# Patient Record
Sex: Female | Born: 1945 | Race: White | Hispanic: No | Smoking: Never smoker
Health system: Southern US, Community
[De-identification: ages and names within clinical notes are randomized; demographics above are authoritative.]

## PROBLEM LIST (undated history)

## (undated) DIAGNOSIS — R7303 Prediabetes: Secondary | ICD-10-CM

## (undated) DIAGNOSIS — M1990 Unspecified osteoarthritis, unspecified site: Secondary | ICD-10-CM

## (undated) DIAGNOSIS — J329 Chronic sinusitis, unspecified: Secondary | ICD-10-CM

## (undated) DIAGNOSIS — D649 Anemia, unspecified: Secondary | ICD-10-CM

## (undated) DIAGNOSIS — I10 Essential (primary) hypertension: Secondary | ICD-10-CM

## (undated) DIAGNOSIS — R011 Cardiac murmur, unspecified: Secondary | ICD-10-CM

## (undated) DIAGNOSIS — R0600 Dyspnea, unspecified: Secondary | ICD-10-CM

## (undated) DIAGNOSIS — K219 Gastro-esophageal reflux disease without esophagitis: Secondary | ICD-10-CM

## (undated) DIAGNOSIS — C4490 Unspecified malignant neoplasm of skin, unspecified: Secondary | ICD-10-CM

## (undated) DIAGNOSIS — E785 Hyperlipidemia, unspecified: Secondary | ICD-10-CM

## (undated) DIAGNOSIS — N819 Female genital prolapse, unspecified: Secondary | ICD-10-CM

## (undated) DIAGNOSIS — E559 Vitamin D deficiency, unspecified: Secondary | ICD-10-CM

## (undated) DIAGNOSIS — Z87442 Personal history of urinary calculi: Secondary | ICD-10-CM

## (undated) DIAGNOSIS — K08409 Partial loss of teeth, unspecified cause, unspecified class: Secondary | ICD-10-CM

## (undated) HISTORY — PX: HIP ARTHROPLASTY: SHX981

## (undated) HISTORY — PX: CATARACT EXTRACTION W/ INTRAOCULAR LENS IMPLANT: SHX1309

## (undated) HISTORY — PX: COLON SURGERY: SHX602

---

## 2006-10-11 ENCOUNTER — Ambulatory Visit (HOSPITAL_BASED_OUTPATIENT_CLINIC_OR_DEPARTMENT_OTHER): Admission: RE | Admit: 2006-10-11 | Discharge: 2006-10-11 | Payer: Self-pay | Admitting: Plastic Surgery

## 2006-10-11 ENCOUNTER — Encounter (INDEPENDENT_AMBULATORY_CARE_PROVIDER_SITE_OTHER): Payer: Self-pay | Admitting: *Deleted

## 2008-12-27 DIAGNOSIS — G459 Transient cerebral ischemic attack, unspecified: Secondary | ICD-10-CM

## 2008-12-27 HISTORY — DX: Transient cerebral ischemic attack, unspecified: G45.9

## 2010-11-11 ENCOUNTER — Inpatient Hospital Stay (HOSPITAL_COMMUNITY): Admission: RE | Admit: 2010-11-11 | Discharge: 2010-11-15 | Payer: Self-pay | Admitting: Orthopedic Surgery

## 2011-02-26 IMAGING — CR DG HIP COMPLETE 2+V*R*
3 series · 3 of 3 positions shown · non-contrast
Comparison: None.

CLINICAL DATA: Preop for right total hip prosthesis with the
advanced right hip osteoarthritis.

RIGHT HIP - COMPLETE 2+ VIEW

[t pelvis a.p.]
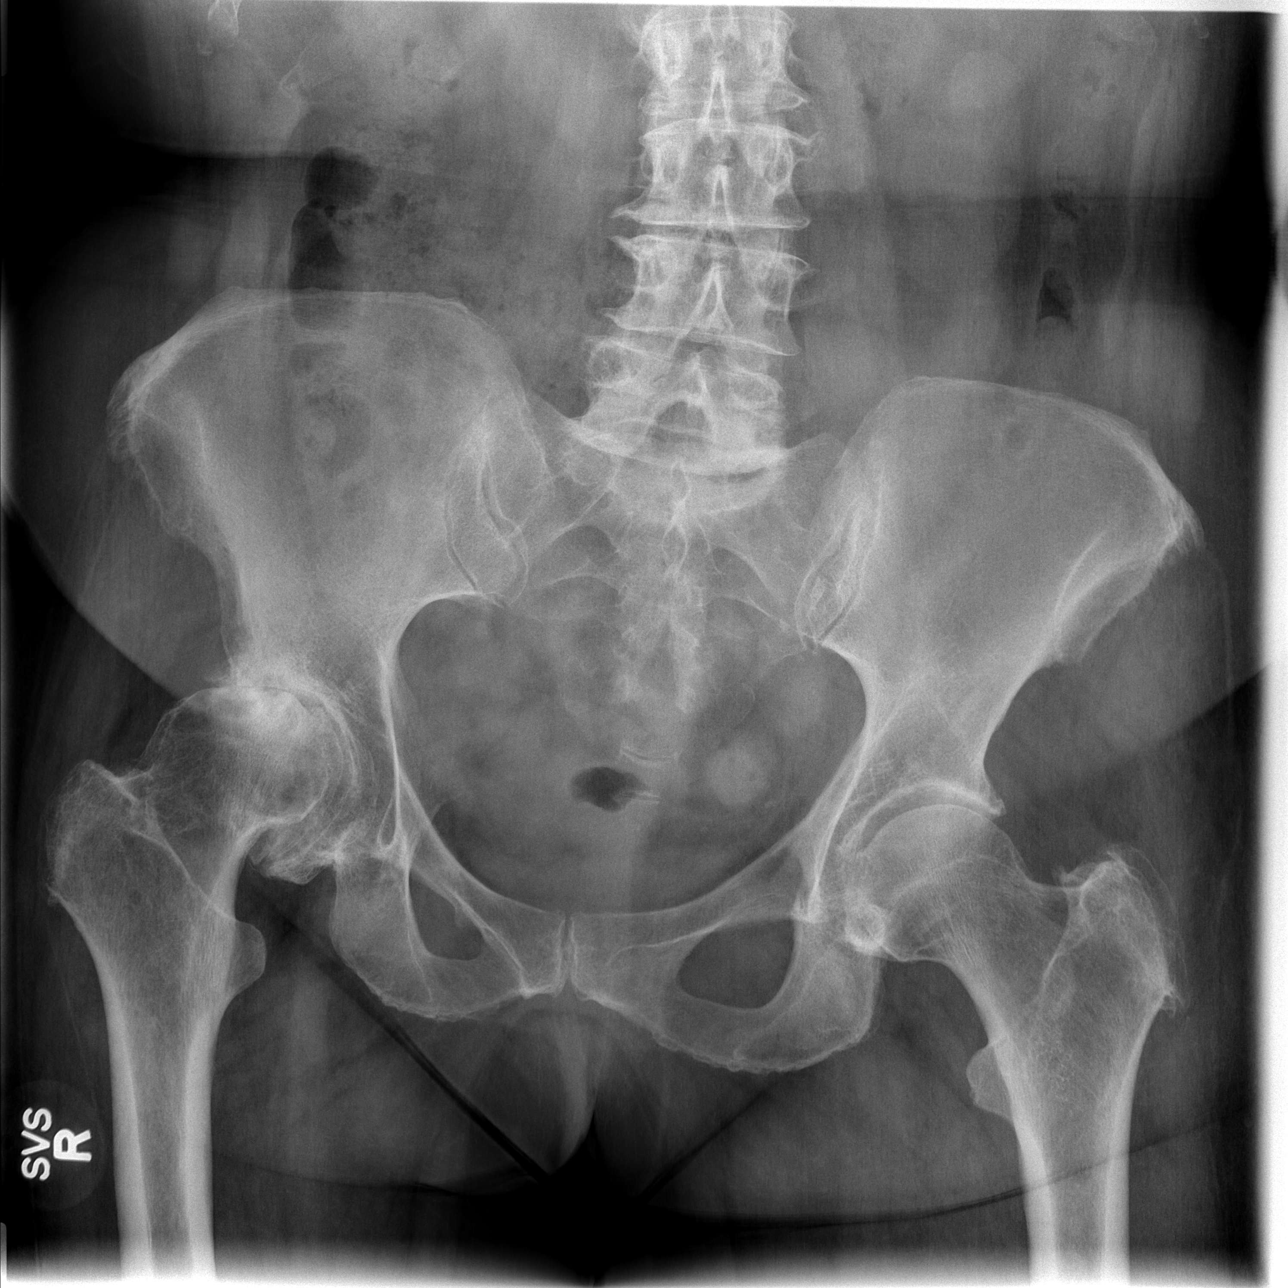

[t hip ap right]
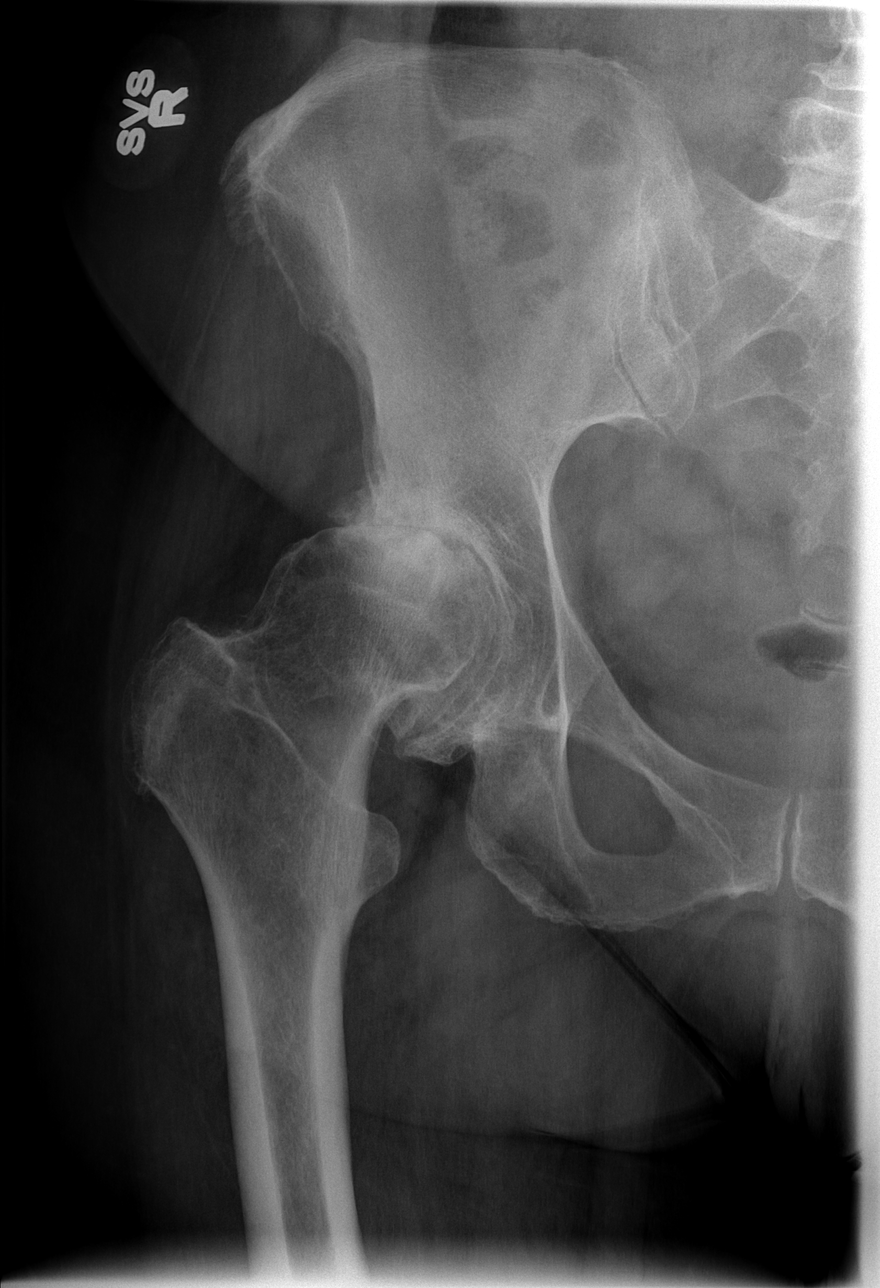

[t hip frog leg right]
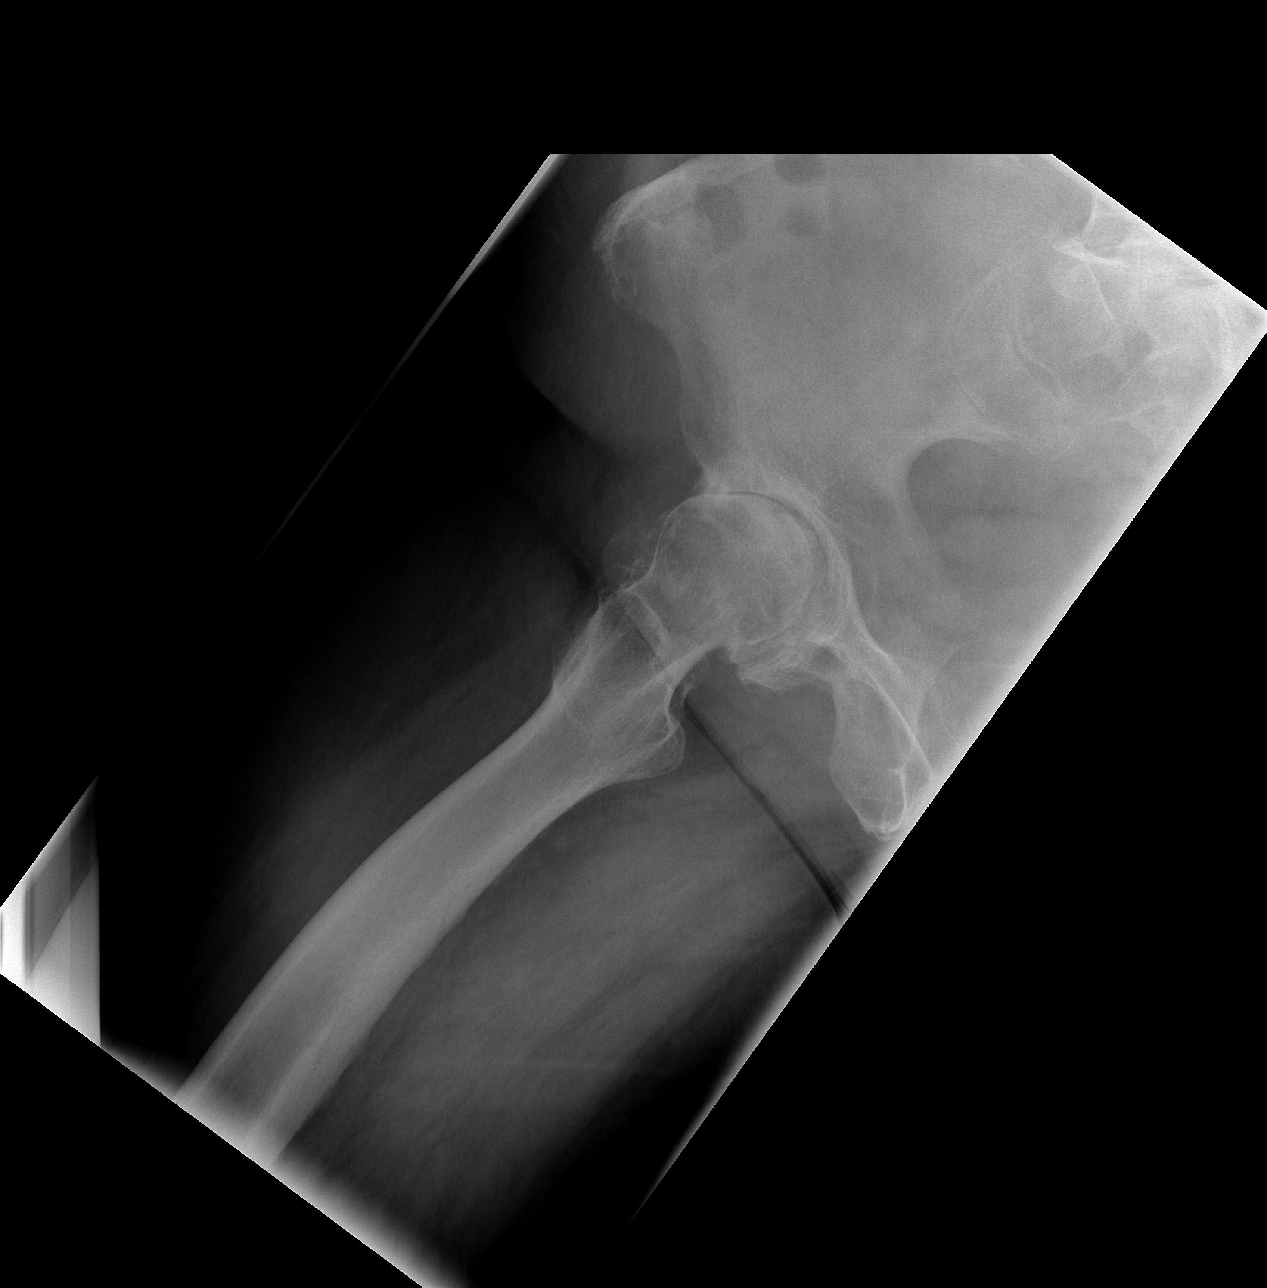

[3 of 3 positions shown; findings below may reference images not displayed]

FINDINGS: Advanced right hip osteoarthritic change superolaterally
seen (with chronic deformity and subchondral cysts supralateral
right humeral head).  Slight levoscoliosis with multilevel
degenerative disc and vertebral changes inferior lumbar spine is
seen.  Slight supralateral left hip osteoarthritic change noted.
No acute findings.
IMPRESSION: 1.  Advanced right hip osteoarthritis with slight supralateral left
hip osteoarthritis.
2.  Multilevel degenerative disc vertebral changes inferior lumbar
spine.
3.  No acute findings.

## 2011-03-03 IMAGING — CR DG PORTABLE PELVIS
1 series · 1 of 1 positions shown · non-contrast
Comparison: 11/06/2010

CLINICAL DATA: Post right hip arthroplasty

PORTABLE PELVIS

[series [date]]
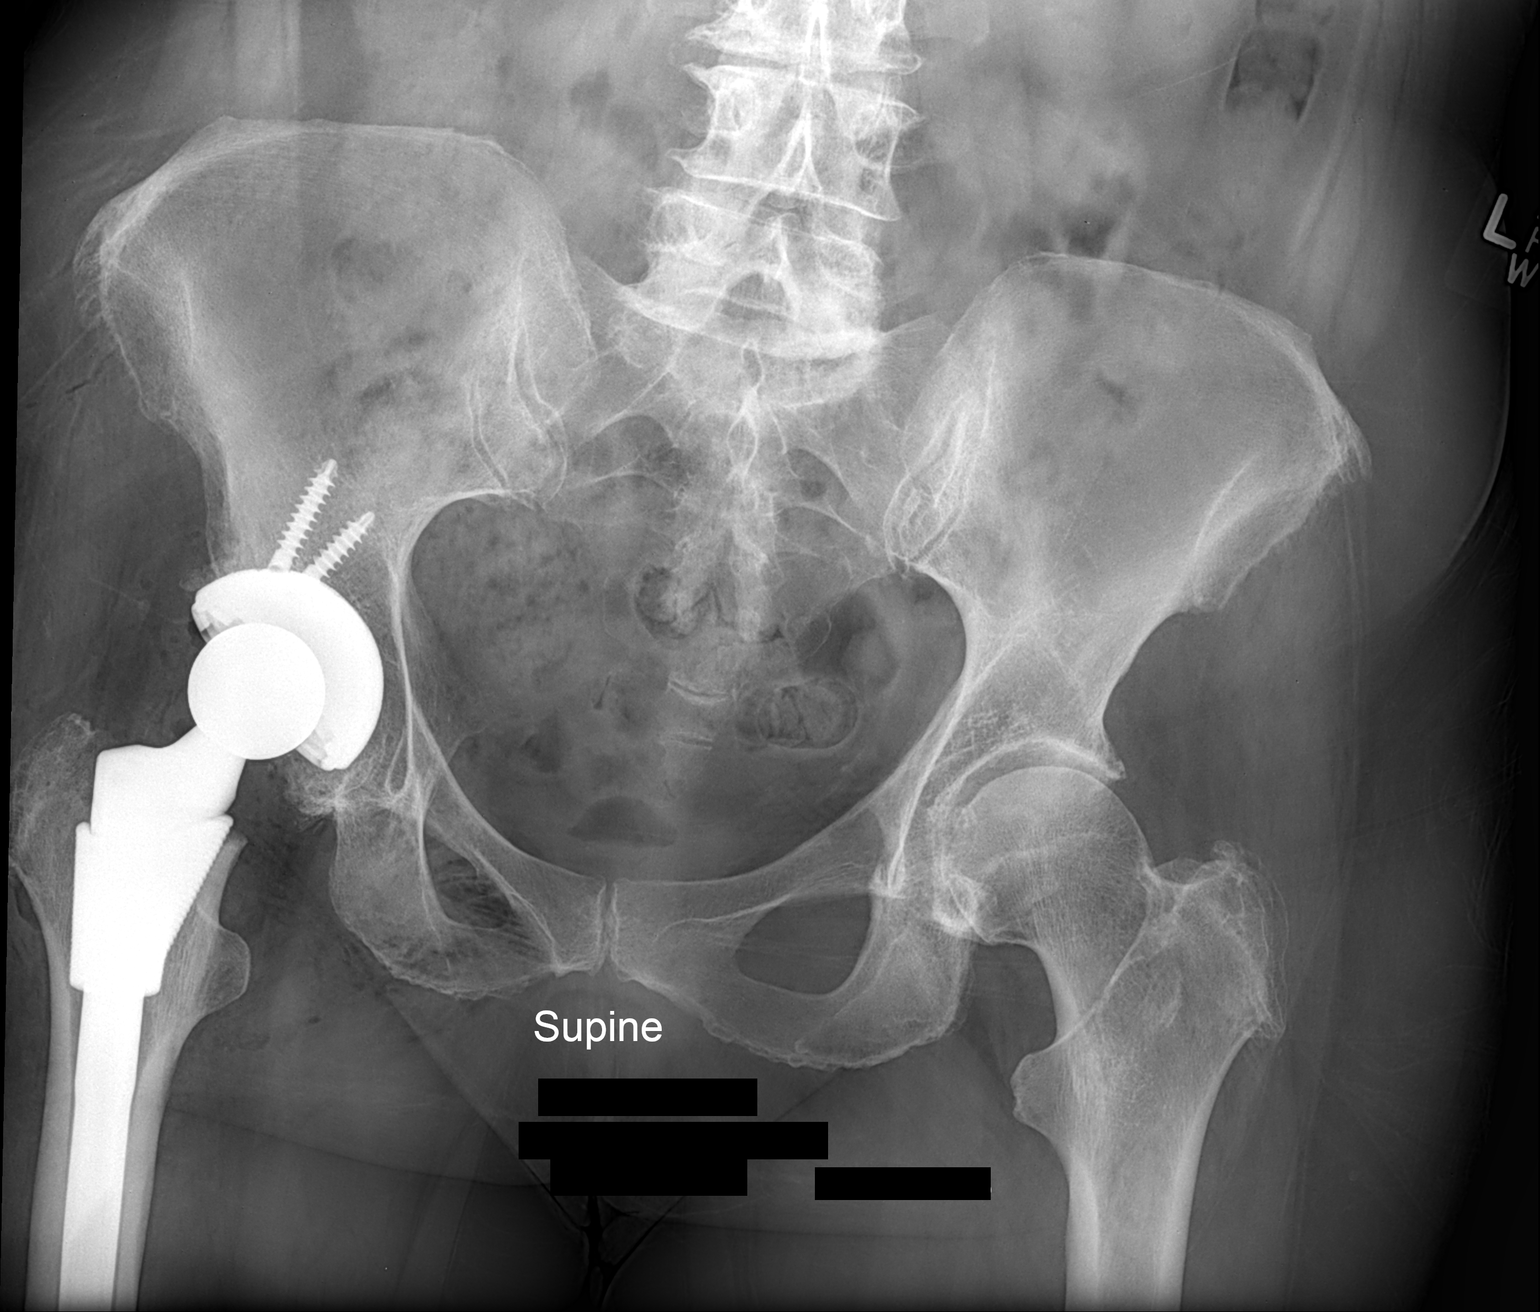

[1 of 1 positions shown; findings below may reference images not displayed]

FINDINGS: Post right hip arthroplasty.  Good position and alignment
without obvious complications, in one-view.
IMPRESSION: Good position and alignment in the AP projection, following right
hip arthroplasty.

## 2011-03-09 LAB — URINALYSIS, ROUTINE W REFLEX MICROSCOPIC
Bilirubin Urine: NEGATIVE
Hgb urine dipstick: NEGATIVE
Specific Gravity, Urine: 1.015 (ref 1.005–1.030)
pH: 7 (ref 5.0–8.0)

## 2011-03-09 LAB — CBC
HCT: 30.9 % — ABNORMAL LOW (ref 36.0–46.0)
HCT: 32.1 % — ABNORMAL LOW (ref 36.0–46.0)
HCT: 32.2 % — ABNORMAL LOW (ref 36.0–46.0)
Hemoglobin: 11.3 g/dL — ABNORMAL LOW (ref 12.0–15.0)
Hemoglobin: 14.5 g/dL (ref 12.0–15.0)
MCH: 33.8 pg (ref 26.0–34.0)
MCH: 34.2 pg — ABNORMAL HIGH (ref 26.0–34.0)
MCH: 34.4 pg — ABNORMAL HIGH (ref 26.0–34.0)
MCHC: 35.2 g/dL (ref 30.0–36.0)
MCV: 96.6 fL (ref 78.0–100.0)
MCV: 97.1 fL (ref 78.0–100.0)
MCV: 98.5 fL (ref 78.0–100.0)
MCV: 98.6 fL (ref 78.0–100.0)
RBC: 3.14 MIL/uL — ABNORMAL LOW (ref 3.87–5.11)
RBC: 3.27 MIL/uL — ABNORMAL LOW (ref 3.87–5.11)
RBC: 4.3 MIL/uL (ref 3.87–5.11)
RDW: 11.5 % (ref 11.5–15.5)
RDW: 11.9 % (ref 11.5–15.5)
WBC: 11.9 10*3/uL — ABNORMAL HIGH (ref 4.0–10.5)
WBC: 12 10*3/uL — ABNORMAL HIGH (ref 4.0–10.5)
WBC: 8.1 10*3/uL (ref 4.0–10.5)

## 2011-03-09 LAB — COMPREHENSIVE METABOLIC PANEL
AST: 25 U/L (ref 0–37)
Alkaline Phosphatase: 63 U/L (ref 39–117)
BUN: 11 mg/dL (ref 6–23)
CO2: 30 mEq/L (ref 19–32)
Chloride: 101 mEq/L (ref 96–112)
Creatinine, Ser: 0.71 mg/dL (ref 0.4–1.2)
GFR calc Af Amer: >60 mL/min (ref >60)
GFR calc non Af Amer: >60 mL/min (ref >60)
Potassium: 4.1 mEq/L (ref 3.5–5.1)
Total Bilirubin: 0.5 mg/dL (ref 0.3–1.2)

## 2011-03-09 LAB — TYPE AND SCREEN
ABO/RH(D): A POS
Antibody Screen: POSITIVE
DAT, IgG: NEGATIVE
PT AG Type: NEGATIVE
Unit division: 0
Unit division: 0

## 2011-03-09 LAB — BASIC METABOLIC PANEL
BUN: 7 mg/dL (ref 6–23)
BUN: 9 mg/dL (ref 6–23)
CO2: 30 mEq/L (ref 19–32)
CO2: 33 mEq/L — ABNORMAL HIGH (ref 19–32)
Chloride: 103 mEq/L (ref 96–112)
Chloride: 97 mEq/L (ref 96–112)
Creatinine, Ser: 0.77 mg/dL (ref 0.4–1.2)
GFR calc non Af Amer: >60 mL/min (ref >60)
Glucose, Bld: 128 mg/dL — ABNORMAL HIGH (ref 70–99)
Potassium: 3 mEq/L — ABNORMAL LOW (ref 3.5–5.1)
Potassium: 4.8 mEq/L (ref 3.5–5.1)
Sodium: 137 mEq/L (ref 135–145)

## 2011-03-09 LAB — PROTIME-INR
INR: 1.17 (ref 0.00–1.49)
INR: 2.05 — ABNORMAL HIGH (ref 0.00–1.49)
Prothrombin Time: 23.3 seconds — ABNORMAL HIGH (ref 11.6–15.2)

## 2011-03-09 LAB — APTT: aPTT: 32 seconds (ref 24–37)

## 2023-04-05 NOTE — H&P (Signed)
TOTAL HIP ADMISSION H&P  Patient is admitted for left total hip arthroplasty.  Subjective:  Chief Complaint: Left hip pain  HPI: Kaitlyn Gallegos, 77 y.o. female, has a history of pain and functional disability in the left hip due to arthritis and patient has failed non-surgical conservative treatments for greater than 12 weeks to include NSAID's and/or analgesics and activity modification. Onset of symptoms was gradual, starting  several  years ago with gradually worsening course since that time. The patient noted no past surgery on the left hip. Patient currently rates pain in the left hip at 8 out of 10 with activity. Patient has night pain, worsening of pain with activity and weight bearing, and trendelenberg gait. Patient has evidence of  severe bone-on-bone arthritis with massive marginal osteophytes and some slight erosion of the femoral head  by imaging studies. This condition presents safety issues increasing the risk of falls. There is no current active infection.  There are no problems to display for this patient.   No past medical history on file.  Prior to Admission medications   Not on File    Not on File  Social History   Socioeconomic History   Marital status: Married    Spouse name: Not on file   Number of children: Not on file   Years of education: Not on file   Highest education level: Not on file  Occupational History   Not on file  Tobacco Use   Smoking status: Not on file   Smokeless tobacco: Not on file  Substance and Sexual Activity   Alcohol use: Not on file   Drug use: Not on file   Sexual activity: Not on file  Other Topics Concern   Not on file  Social History Narrative   Not on file   Social Determinants of Health   Financial Resource Strain: Not on file  Food Insecurity: Not on file  Transportation Needs: Not on file  Physical Activity: Not on file  Stress: Not on file  Social Connections: Not on file  Intimate Partner Violence: Not on file     Tobacco Use: Not on file   Social History   Substance and Sexual Activity  Alcohol Use Not on file    No family history on file.  Review of Systems  Constitutional:  Negative for chills and fever.  HENT: Negative.    Eyes: Negative.   Respiratory:  Negative for cough and shortness of breath.   Cardiovascular:  Negative for chest pain and palpitations.  Gastrointestinal:  Negative for abdominal pain, constipation, diarrhea, nausea and vomiting.  Genitourinary:  Negative for dysuria, frequency and urgency.  Musculoskeletal:  Positive for joint pain.  Skin:  Negative for rash.   Objective:  Physical Exam: Well nourished and well developed.  General: Alert and oriented x3, cooperative and pleasant, no acute distress.  Head: normocephalic, atraumatic, neck supple.  Eyes: EOMI. Abdomen: non-tender to palpation and soft, normoactive bowel sounds. Musculoskeletal: The patient has a significantly antalgic gait pattern favoring the left side.  Right Hip Exam: The range of motion: Flexion to 130 degrees, Internal Rotation to 30 degrees, External Rotation to 40 degrees, and abduction to 40 degrees without discomfort. There is no tenderness over the greater trochanteric bursa.  Left Hip Exam: The range of motion: Flexion to 100 degrees, Internal Rotation to 0 degrees, External Rotation to 0 degrees, and abduction to 10 degrees without discomfort. There is no tenderness over the greater trochanteric bursa.  Calves soft  and nontender. Motor function intact in LE. Strength 5/5 LE bilaterally. Neuro: Distal pulses 2+. Sensation to light touch intact in LE.  Vital signs in last 24 hours: BP: ()/()  Arterial Line BP: ()/()   Imaging Review Plain radiographs demonstrate moderate degenerative joint disease of the left hip. The bone quality appears to be adequate for age and reported activity level.  Assessment/Plan:  End stage arthritis, left hip  The patient history, physical  examination, clinical judgement of the provider and imaging studies are consistent with end stage degenerative joint disease of the left hip and total hip arthroplasty is deemed medically necessary. The treatment options including medical management, injection therapy, arthroscopy and arthroplasty were discussed at length. The risks and benefits of total hip arthroplasty were presented and reviewed. The risks due to aseptic loosening, infection, stiffness, dislocation/subluxation, thromboembolic complications and other imponderables were discussed. The patient acknowledged the explanation, agreed to proceed with the plan and consent was signed. Patient is being admitted for inpatient treatment for surgery, pain control, PT, OT, prophylactic antibiotics, VTE prophylaxis, progressive ambulation and ADLs and discharge planning.The patient is planning to be discharged  home .  Therapy Plans: HEP Disposition: Home with Daughter Planned DVT Prophylaxis: Xarelto 10 mg (hx of stroke) DME Needed: RW PCP: Jola Schmidt, MD (clearance received) TXA: IV Allergies: lisinopril (cough), metformin (nausea) Anesthesia Concerns: None BMI: 29.8 Last HgbA1c: not diabetic  [facility]: [facility], [place])  Other: -Recently stopped clopidogrel and switched to one 81 mg aspirin for chronic blood thinner  - Patient was instructed on what medications to stop prior to surgery. - Follow-up visit in 2 weeks with Dr. Lequita Halt - Begin physical therapy following surgery - Pre-operative lab work as pre-surgical testing - Prescriptions will be provided in hospital at time of discharge  R. Arcola Jansky, PA-C Orthopedic Surgery [facility] [place]

## 2023-04-14 ENCOUNTER — Encounter (HOSPITAL_COMMUNITY): Payer: Self-pay

## 2023-04-14 NOTE — Patient Instructions (Addendum)
SURGICAL WAITING ROOM VISITATION  Patients having surgery or a procedure may have no more than 2 support people in the waiting area - these visitors may rotate.    Children under the age of 14 must have an adult with them who is not the patient.  Due to an increase in RSV and influenza rates and associated hospitalizations, children ages 52 and under may not visit patients in [facility] hospitals.  If the patient needs to stay at the hospital during part of their recovery, the visitor guidelines for inpatient rooms apply. Pre-op nurse will coordinate an appropriate time for 1 support person to accompany patient in pre-op.  This support person may not rotate.    Please refer to the [facility] website for the visitor guidelines for Inpatients (after your surgery is over and you are in a regular room).       Your procedure is scheduled on: 04-25-23   Report to [facility] Main Entrance    Report to admitting at 10:10 AM   Call this number if you have problems the morning of surgery [phone]   Do not eat food :After Midnight.   After Midnight you may have the following liquids until 9:40 AM DAY OF SURGERY  Water Non-Citrus Juices (without pulp, NO RED-Apple, White grape, White cranberry) Black Coffee (NO MILK/CREAM OR CREAMERS, sugar ok)  Clear Tea (NO MILK/CREAM OR CREAMERS, sugar ok) regular and decaf                             Plain Jell-O (NO RED)                                           Fruit ices (not with fruit pulp, NO RED)                                     Popsicles (NO RED)                                                               Sports drinks like Gatorade (NO RED)                  The day of surgery:  Drink ONE (1) Pre-Surgery G2 at 9:40 AM the morning of surgery. Drink in one sitting. Do not sip.  This drink was given to you during your hospital  pre-op appointment visit. Nothing else to drink after completing the Pre-Surgery G2.          If  you have questions, please contact your surgeon's office.   FOLLOW  ANY ADDITIONAL PRE OP INSTRUCTIONS YOU RECEIVED FROM YOUR SURGEON'S OFFICE!!!     Oral Hygiene is also important to reduce your risk of infection.                                    Remember - BRUSH YOUR TEETH THE MORNING OF SURGERY WITH YOUR REGULAR TOOTHPASTE  DENTURES WILL BE  REMOVED PRIOR TO SURGERY PLEASE DO NOT APPLY "Poly grip" OR ADHESIVES!!!   Do NOT smoke after Midnight   Take these medicines the morning of surgery with A SIP OF WATER:   Fenofibrate  Allegra   Hydralazine  Trospium  Tylenol or Tramadol if needed                              You may not have any metal on your body including hair pins, jewelry, and body piercing             Do not wear make-up, lotions, powders, perfumes or deodorant  Do not wear nail polish including gel and S&S, artificial/acrylic nails, or any other type of covering on natural nails including finger and toenails. If you have artificial nails, gel coating, etc. that needs to be removed by a nail salon please have this removed prior to surgery or surgery may need to be canceled/ delayed if the surgeon/ anesthesia feels like they are unable to be safely monitored.   Do not shave  48 hours prior to surgery.    Do not bring valuables to the hospital. [facility] IS NOT  RESPONSIBLE   FOR VALUABLES.   Contacts, glasses, dentures or bridgework may not be worn into surgery.   Bring small overnight bag day of surgery.   DO NOT BRING YOUR HOME MEDICATIONS TO THE HOSPITAL. PHARMACY WILL DISPENSE MEDICATIONS LISTED ON YOUR MEDICATION LIST TO YOU DURING YOUR ADMISSION IN THE HOSPITAL!               Please read over the following fact sheets you were given: IF YOU HAVE QUESTIONS ABOUT YOUR PRE-OP INSTRUCTIONS PLEASE CALL [phone] [facility]   If you received a COVID test during your pre-op visit  it is requested that you wear a mask when out in public, stay away from anyone that  may not be feeling well and notify your surgeon if you develop symptoms. If you test positive for Covid or have been in contact with anyone that has tested positive in the last 10 days please notify you surgeon.      Incentive Spirometer  An incentive spirometer is a tool that can help keep your lungs clear and active. This tool measures how well you are filling your lungs with each breath. Taking long deep breaths may help reverse or decrease the chance of developing breathing (pulmonary) problems (especially infection) following: A long period of time when you are unable to move or be active. BEFORE THE PROCEDURE  If the spirometer includes an indicator to show your best effort, your nurse or respiratory therapist will set it to a desired goal. If possible, sit up straight or lean slightly forward. Try not to slouch. Hold the incentive spirometer in an upright position. INSTRUCTIONS FOR USE  Sit on the edge of your bed if possible, or sit up as far as you can in bed or on a chair. Hold the incentive spirometer in an upright position. Breathe out normally. Place the mouthpiece in your mouth and seal your lips tightly around it. Breathe in slowly and as deeply as possible, raising the piston or the ball toward the top of the column. Hold your breath for 3-5 seconds or for as long as possible. Allow the piston or ball to fall to the bottom of the column. Remove the mouthpiece from your mouth and breathe out normally. Rest for a few seconds  and repeat Steps 1 through 7 at least 10 times every 1-2 hours when you are awake. Take your time and take a few normal breaths between deep breaths. The spirometer may include an indicator to show your best effort. Use the indicator as a goal to work toward during each repetition. After each set of 10 deep breaths, practice coughing to be sure your lungs are clear. If you have an incision (the cut made at the time of surgery), support your incision when  coughing by placing a pillow or rolled up towels firmly against it. Once you are able to get out of bed, walk around indoors and cough well. You may stop using the incentive spirometer when instructed by your caregiver.  RISKS AND COMPLICATIONS Take your time so you do not get dizzy or light-headed. If you are in pain, you may need to take or ask for pain medication before doing incentive spirometry. It is harder to take a deep breath if you are having pain. AFTER USE Rest and breathe slowly and easily. It can be helpful to keep track of a log of your progress. Your caregiver can provide you with a simple table to help with this. If you are using the spirometer at home, follow these instructions: SEEK MEDICAL CARE IF:  You are having difficultly using the spirometer. You have trouble using the spirometer as often as instructed. Your pain medication is not giving enough relief while using the spirometer. You develop fever of 100.5 F (38.1 C) or higher. SEEK IMMEDIATE MEDICAL CARE IF:  You cough up bloody sputum that had not been present before. You develop fever of 102 F (38.9 C) or greater. You develop worsening pain at or near the incision site. MAKE SURE YOU:  Understand these instructions. Will watch your condition. Will get help right away if you are not doing well or get worse. Document Released: 04/25/2007 Document Revised: 03/06/2012 Document Reviewed: 06/26/2007 Exit[employer] Patient Information 2014 [employer], [place].   ________________________________________________________________________ WHAT IS A BLOOD TRANSFUSION? Blood Transfusion Information  A transfusion is the replacement of blood or some of its parts. Blood is made up of multiple cells which provide different functions. Red blood cells carry oxygen and are used for blood loss replacement. White blood cells fight against infection. Platelets control bleeding. Plasma helps clot blood. Other blood products are  available for specialized needs, such as hemophilia or other clotting disorders. BEFORE THE TRANSFUSION  Who gives blood for transfusions?  Healthy volunteers who are fully evaluated to make sure their blood is safe. This is blood bank blood. Transfusion therapy is the safest it has ever been in the practice of medicine. Before blood is taken from a donor, a complete history is taken to make sure that person has no history of diseases nor engages in risky social behavior (examples are intravenous drug use or sexual activity with multiple partners). The donor's travel history is screened to minimize risk of transmitting infections, such as malaria. The donated blood is tested for signs of infectious diseases, such as HIV and hepatitis. The blood is then tested to be sure it is compatible with you in order to minimize the chance of a transfusion reaction. If you or a relative donates blood, this is often done in anticipation of surgery and is not appropriate for emergency situations. It takes many days to process the donated blood. RISKS AND COMPLICATIONS Although transfusion therapy is very safe and saves many lives, the main dangers of transfusion include:  Getting  an infectious disease. Developing a transfusion reaction. This is an allergic reaction to something in the blood you were given. Every precaution is taken to prevent this. The decision to have a blood transfusion has been considered carefully by your caregiver before blood is given. Blood is not given unless the benefits outweigh the risks. AFTER THE TRANSFUSION Right after receiving a blood transfusion, you will usually feel much better and more energetic. This is especially true if your red blood cells have gotten low (anemic). The transfusion raises the level of the red blood cells which carry oxygen, and this usually causes an energy increase. The nurse administering the transfusion will monitor you carefully for complications. HOME CARE  INSTRUCTIONS  No special instructions are needed after a transfusion. You may find your energy is better. Speak with your caregiver about any limitations on activity for underlying diseases you may have. SEEK MEDICAL CARE IF:  Your condition is not improving after your transfusion. You develop redness or irritation at the intravenous (IV) site. SEEK IMMEDIATE MEDICAL CARE IF:  Any of the following symptoms occur over the next 12 hours: Shaking chills. You have a temperature by mouth above 102 F (38.9 C), not controlled by medicine. Chest, back, or muscle pain. People around you feel you are not acting correctly or are confused. Shortness of breath or difficulty breathing. Dizziness and fainting. You get a rash or develop hives. You have a decrease in urine output. Your urine turns a dark color or changes to pink, red, or brown. Any of the following symptoms occur over the next 10 days: You have a temperature by mouth above 102 F (38.9 C), not controlled by medicine. Shortness of breath. Weakness after normal activity. The white part of the eye turns yellow (jaundice). You have a decrease in the amount of urine or are urinating less often. Your urine turns a dark color or changes to pink, red, or brown. Document Released: 12/10/2000 Document Revised: 03/06/2012 Document Reviewed: 07/29/2008 [facility] Patient Information 2014 [place], [place].  _______________________________________________________________________

## 2023-04-14 NOTE — Progress Notes (Addendum)
COVID Vaccine Completed:  Yes  Date of COVID positive in last 90 days:  No  PCP - Jola Schmidt Cardiologist -  N/A  Chest x-ray - N/A EKG - 04-20-23 Epic Stress Test - yes in the past ECHO - 07-03-20 [facility] Cardiac Cath - N/A Pacemaker/ICD device last checked: Spinal Cord Stimulator:  Bowel Prep - N/A  Sleep Study - N/A CPAP -   Prediabetes,  CBG 116 at PAT Fasting Blood Sugar -  Checks Blood Sugar - does not check   Last dose of GLP1 agonist-  N/A GLP1 instructions:  N/A   Last dose of SGLT-2 inhibitors-  N/A SGLT-2 instructions: N/A  Blood Thinner Instructions:   Aspirin Instructions:  ASA 81.  Per patient to stop 5 days ahead Last Dose: 04-19-23  Activity level:  Can go up a flight of stairs and perform activities of daily living without stopping and without symptoms of chest pain.  Patient does have  shortness of breath climbing a flight of stairs..  Anesthesia review:  Hx of murmur.  Patient states that she has shortness of breath climbing stairs.  Patient denies shortness of breath, fever, cough and chest pain at PAT appointment  Patient verbalized understanding of instructions that were given to them at the PAT appointment. Patient was also instructed that they will need to review over the PAT instructions again at home before surgery.

## 2023-04-20 ENCOUNTER — Other Ambulatory Visit: Payer: Self-pay

## 2023-04-20 ENCOUNTER — Encounter (HOSPITAL_COMMUNITY): Payer: Self-pay | Admitting: *Deleted

## 2023-04-20 ENCOUNTER — Encounter (HOSPITAL_COMMUNITY)
Admission: RE | Admit: 2023-04-20 | Discharge: 2023-04-20 | Disposition: A | Discharge status: Home / Self Care | Payer: Medicare Other | Source: Ambulatory Visit | Attending: Orthopedic Surgery | Admitting: Orthopedic Surgery

## 2023-04-20 VITALS — BP 156/84 | HR 80 | Temp 98.2°F | Resp 20 | Ht 65.0 in | Wt 167.0 lb

## 2023-04-20 DIAGNOSIS — R011 Cardiac murmur, unspecified: Secondary | ICD-10-CM | POA: Insufficient documentation

## 2023-04-20 DIAGNOSIS — I10 Essential (primary) hypertension: Secondary | ICD-10-CM | POA: Insufficient documentation

## 2023-04-20 DIAGNOSIS — M1612 Unilateral primary osteoarthritis, left hip: Secondary | ICD-10-CM | POA: Diagnosis not present

## 2023-04-20 DIAGNOSIS — R7303 Prediabetes: Secondary | ICD-10-CM | POA: Diagnosis not present

## 2023-04-20 DIAGNOSIS — Z01818 Encounter for other preprocedural examination: Secondary | ICD-10-CM | POA: Insufficient documentation

## 2023-04-20 DIAGNOSIS — Z8673 Personal history of transient ischemic attack (TIA), and cerebral infarction without residual deficits: Secondary | ICD-10-CM | POA: Insufficient documentation

## 2023-04-20 HISTORY — DX: Chronic sinusitis, unspecified: J32.9

## 2023-04-20 HISTORY — DX: Prediabetes: R73.03

## 2023-04-20 HISTORY — DX: Gastro-esophageal reflux disease without esophagitis: K21.9

## 2023-04-20 HISTORY — DX: Anemia, unspecified: D64.9

## 2023-04-20 HISTORY — DX: Essential (primary) hypertension: I10

## 2023-04-20 HISTORY — DX: Female genital prolapse, unspecified: N81.9

## 2023-04-20 HISTORY — DX: Partial loss of teeth, unspecified cause, unspecified class: K08.409

## 2023-04-20 HISTORY — DX: Dyspnea, unspecified: R06.00

## 2023-04-20 HISTORY — DX: Personal history of urinary calculi: Z87.442

## 2023-04-20 HISTORY — DX: Hyperlipidemia, unspecified: E78.5

## 2023-04-20 HISTORY — DX: Unspecified malignant neoplasm of skin, unspecified: C44.90

## 2023-04-20 HISTORY — DX: Cardiac murmur, unspecified: R01.1

## 2023-04-20 HISTORY — DX: Unspecified osteoarthritis, unspecified site: M19.90

## 2023-04-20 HISTORY — DX: Vitamin D deficiency, unspecified: E55.9

## 2023-04-20 LAB — HEMOGLOBIN A1C
Hgb A1c MFr Bld: 6.5 % — ABNORMAL HIGH (ref 4.8–5.6)
Mean Plasma Glucose: 140 mg/dL

## 2023-04-20 LAB — GLUCOSE, CAPILLARY: Glucose-Capillary: 116 mg/dL — ABNORMAL HIGH (ref 70–99)

## 2023-04-20 LAB — BASIC METABOLIC PANEL
Anion gap: 10 (ref 5–15)
BUN: 25 mg/dL — ABNORMAL HIGH (ref 8–23)
CO2: 23 mmol/L (ref 22–32)
Calcium: 9.4 mg/dL (ref 8.9–10.3)
Chloride: 105 mmol/L (ref 98–111)
Creatinine, Ser: 0.89 mg/dL (ref 0.44–1.00)
GFR, Estimated: >60 mL/min (ref >60)
Glucose, Bld: 111 mg/dL — ABNORMAL HIGH (ref 70–99)
Potassium: 4.3 mmol/L (ref 3.5–5.1)
Sodium: 138 mmol/L (ref 135–145)

## 2023-04-20 LAB — SURGICAL PCR SCREEN
MRSA, PCR: NEGATIVE
Staphylococcus aureus: NEGATIVE

## 2023-04-20 LAB — CBC
HCT: 41.6 % (ref 36.0–46.0)
Hemoglobin: 13.9 g/dL (ref 12.0–15.0)
MCH: 31.5 pg (ref 26.0–34.0)
MCHC: 33.4 g/dL (ref 30.0–36.0)
MCV: 94.3 fL (ref 80.0–100.0)
Platelets: 419 10*3/uL — ABNORMAL HIGH (ref 150–400)
RBC: 4.41 MIL/uL (ref 3.87–5.11)
RDW: 13.2 % (ref 11.5–15.5)
WBC: 7.8 10*3/uL (ref 4.0–10.5)
nRBC: 0 % (ref 0.0–0.2)

## 2023-04-21 NOTE — Progress Notes (Signed)
Case: [id number] Date/Time: 04/25/23 1225   Procedure: TOTAL HIP ARTHROPLASTY ANTERIOR APPROACH (Left: Hip)   Anesthesia type: Choice   Pre-op diagnosis: Left hip osteoarthritis   Location: [facility] ROOM 10 / [facility]   Surgeons: Ollen Gross, MD       DISCUSSION: Kaitlyn Gallegos is a 77 yo female who presents to PAT clinic. She has a hx of a heart murmur, HTN, hx of TIA in 2010. No prior anesthesia complications.   Patient evaluated in person at PAT visit. She denies CP or any significant cardiac/pulmonary hx. She reports SOB going up stairs which she attributes to deconditioning. She does have an audible systolic murmur best heard in RUSB. Echo report reviewed from 2021 does not show any significant valvular disease.  VS: BP (!) 156/84   Pulse 80   Temp 36.8 C (Oral)   Resp 20   Ht  (1.651 m)   Wt 75.8 kg   SpO2 95%   BMI 27.79 kg/m   PROVIDERS: Rosario Adie, MD   LABS: Labs reviewed: Acceptable for surgery. (all labs ordered are listed, but only abnormal results are displayed)  Labs Reviewed  HEMOGLOBIN A1C - Abnormal; Notable for the following components:      Result Value   Hgb A1c MFr Bld 6.5 (*)    All other components within normal limits  BASIC METABOLIC PANEL - Abnormal; Notable for the following components:   Glucose, Bld 111 (*)    BUN 25 (*)    All other components within normal limits  CBC - Abnormal; Notable for the following components:   Platelets 419 (*)    All other components within normal limits  GLUCOSE, CAPILLARY - Abnormal; Notable for the following components:   Glucose-Capillary 116 (*)    All other components within normal limits  SURGICAL PCR SCREEN  TYPE AND SCREEN     IMAGES: n/a   EKG 04/20/23:  NSR Possible anterior infarct, age undetermined  CV:  Echo 07/03/2020 (Care Everywhere):  Summary   1. The left ventricle is normal in size with upper normal wall thickness.    2. The left ventricular systolic function is normal with no  obvious wall  motion abnormalities, LVEF is visually estimated at 55-60%.    3. The aortic valve is trileaflet with mildly thickened leaflets with normal  excursion.   4. The right ventricle is normal in size, with normal systolic function.    5. Pulmonary systolic pressure cannot be estimated due to insufficient TR  jet.    Past Medical History:  Diagnosis Date   Anemia    Arthritis    Dyspnea    GERD (gastroesophageal reflux disease)    H/O tooth extraction    Heart murmur    History of kidney stones    Hyperlipidemia    Hypertension    Pre-diabetes    Prolapse of female pelvic organs    Sinusitis    Skin cancer    TIA (transient ischemic attack) 2010   Vitamin D deficiency     Past Surgical History:  Procedure Laterality Date   CATARACT EXTRACTION W/ INTRAOCULAR LENS IMPLANT     COLON SURGERY     HIP ARTHROPLASTY Right     MEDICATIONS:  acetaminophen (TYLENOL) 650 MG CR tablet   carboxymethylcellulose (REFRESH PLUS) 0.5 % SOLN   carvedilol (COREG) 12.5 MG tablet   Cholecalciferol 125 MCG (5000 UT) TABS   estradiol (ESTRACE) 0.1 MG/GM vaginal cream   fenofibrate 160 MG  tablet   fexofenadine (ALLEGRA ALLERGY) 180 MG tablet   folic acid (FOLVITE) 400 MCG tablet   hydrALAZINE (APRESOLINE) 100 MG tablet   simvastatin (ZOCOR) 10 MG tablet   topiramate (TOPAMAX) 50 MG tablet   traMADol (ULTRAM) 50 MG tablet   trospium (SANCTURA) 20 MG tablet   aspirin EC 81 MG tablet   No current facility-administered medications for this encounter.   Marcille Blanco [facility] Surgical Short Stay/Anesthesiology [facility] Phone [phone] 04/21/2023 9:33 AM

## 2023-04-21 NOTE — Anesthesia Preprocedure Evaluation (Addendum)
Anesthesia Evaluation  Patient identified by MRN, date of birth, ID band Patient awake    Reviewed: Allergy & Precautions, NPO status , Patient's Chart, lab work & pertinent test results  Airway Mallampati: II  TM Distance: >3 FB Neck ROM: Full    Dental  (+) Dental Advisory Given   Pulmonary neg pulmonary ROS   breath sounds clear to auscultation       Cardiovascular hypertension, Pt. on medications  Rhythm:Regular Rate:Normal     Neuro/Psych TIA   GI/Hepatic Neg liver ROS,GERD  ,,  Endo/Other  negative endocrine ROS    Renal/GU negative Renal ROS     Musculoskeletal  (+) Arthritis ,    Abdominal   Peds  Hematology  (+) Blood dyscrasia, anemia   Anesthesia Other Findings   Reproductive/Obstetrics                              Lab Results  Component Value Date   WBC 7.8 04/20/2023   HGB 13.9 04/20/2023   HCT 41.6 04/20/2023   MCV 94.3 04/20/2023   PLT 419 (H) 04/20/2023   Lab Results  Component Value Date   CREATININE 0.89 04/20/2023   BUN 25 (H) 04/20/2023   NA 138 04/20/2023   K 4.3 04/20/2023   CL 105 04/20/2023   CO2 23 04/20/2023    Anesthesia Physical Anesthesia Plan  ASA: 3  Anesthesia Plan: Spinal   Post-op Pain Management: Ofirmev IV (intra-op)*   Induction:   PONV Risk Score and Plan: 2 and Propofol infusion, Dexamethasone and Ondansetron  Airway Management Planned: Natural Airway and Simple Face Mask  Additional Equipment:   Intra-op Plan:   Post-operative Plan:   Informed Consent: I have reviewed the patients History and Physical, chart, labs and discussed the procedure including the risks, benefits and alternatives for the proposed anesthesia with the patient or authorized representative who has indicated his/her understanding and acceptance.       Plan Discussed with: CRNA  Anesthesia Plan Comments:          Anesthesia Quick  Evaluation

## 2023-04-25 ENCOUNTER — Encounter (HOSPITAL_COMMUNITY): Payer: Self-pay | Admitting: Orthopedic Surgery

## 2023-04-25 ENCOUNTER — Observation Stay (HOSPITAL_COMMUNITY): Payer: Medicare Other

## 2023-04-25 ENCOUNTER — Ambulatory Visit (HOSPITAL_COMMUNITY): Payer: Medicare Other

## 2023-04-25 ENCOUNTER — Encounter (HOSPITAL_COMMUNITY)
Admission: RE | Disposition: A | Discharge status: Home / Self Care | Payer: Self-pay | Source: Ambulatory Visit | Attending: Orthopedic Surgery

## 2023-04-25 ENCOUNTER — Other Ambulatory Visit: Payer: Self-pay

## 2023-04-25 ENCOUNTER — Ambulatory Visit (HOSPITAL_BASED_OUTPATIENT_CLINIC_OR_DEPARTMENT_OTHER): Payer: Medicare Other | Admitting: Certified Registered Nurse Anesthetist

## 2023-04-25 ENCOUNTER — Ambulatory Visit (HOSPITAL_COMMUNITY): Payer: Medicare Other | Admitting: Medical

## 2023-04-25 ENCOUNTER — Observation Stay (HOSPITAL_COMMUNITY)
Admission: RE | Admit: 2023-04-25 | Discharge: 2023-04-27 | Disposition: A | Discharge status: Home / Self Care | Payer: Medicare Other | Source: Ambulatory Visit | Attending: Orthopedic Surgery | Admitting: Orthopedic Surgery

## 2023-04-25 DIAGNOSIS — Z8673 Personal history of transient ischemic attack (TIA), and cerebral infarction without residual deficits: Secondary | ICD-10-CM

## 2023-04-25 DIAGNOSIS — Z85828 Personal history of other malignant neoplasm of skin: Secondary | ICD-10-CM | POA: Diagnosis not present

## 2023-04-25 DIAGNOSIS — I10 Essential (primary) hypertension: Secondary | ICD-10-CM

## 2023-04-25 DIAGNOSIS — M169 Osteoarthritis of hip, unspecified: Secondary | ICD-10-CM | POA: Diagnosis present

## 2023-04-25 DIAGNOSIS — Z01818 Encounter for other preprocedural examination: Secondary | ICD-10-CM

## 2023-04-25 DIAGNOSIS — R7303 Prediabetes: Secondary | ICD-10-CM

## 2023-04-25 DIAGNOSIS — M1612 Unilateral primary osteoarthritis, left hip: Secondary | ICD-10-CM

## 2023-04-25 DIAGNOSIS — D649 Anemia, unspecified: Secondary | ICD-10-CM

## 2023-04-25 DIAGNOSIS — R7309 Other abnormal glucose: Secondary | ICD-10-CM | POA: Diagnosis not present

## 2023-04-25 HISTORY — PX: TOTAL HIP ARTHROPLASTY: SHX124

## 2023-04-25 LAB — BPAM RBC
Blood Product Expiration Date: 202405202359
Blood Product Expiration Date: 202405202359
Blood Product Expiration Date: 202405202359
Unit Type and Rh: 6200
Unit Type and Rh: 6200
Unit Type and Rh: 6200
Unit Type and Rh: 6200

## 2023-04-25 LAB — TYPE AND SCREEN
ABO/RH(D): A POS
ABO/RH(D): A POS
Antibody Screen: POSITIVE
Donor AG Type: NEGATIVE
Unit division: 0
Unit division: 0

## 2023-04-25 LAB — GLUCOSE, CAPILLARY: Glucose-Capillary: 132 mg/dL — ABNORMAL HIGH (ref 70–99)

## 2023-04-25 SURGERY — ARTHROPLASTY, HIP, TOTAL, ANTERIOR APPROACH
Anesthesia: Spinal | Site: Hip | Laterality: Left

## 2023-04-25 MED ORDER — TRANEXAMIC ACID-NACL 1000-0.7 MG/100ML-% IV SOLN
1000.0000 mg | INTRAVENOUS | Status: AC
  Administered 2023-04-25: 1000 mg via INTRAVENOUS
  Filled 2023-04-25: qty 100

## 2023-04-25 MED ORDER — PROPOFOL 500 MG/50ML IV EMUL
INTRAVENOUS | Status: DC | PRN
  Administered 2023-04-25: 40 mg via INTRAVENOUS
  Administered 2023-04-25: 75 ug/kg/min via INTRAVENOUS

## 2023-04-25 MED ORDER — PHENYLEPHRINE HCL-NACL 20-0.9 MG/250ML-% IV SOLN
INTRAVENOUS | Status: DC | PRN
  Administered 2023-04-25: 40 ug/min via INTRAVENOUS

## 2023-04-25 MED ORDER — CEFAZOLIN SODIUM-DEXTROSE 2-4 GM/100ML-% IV SOLN
2.0000 g | Freq: Four times a day (QID) | INTRAVENOUS | Status: AC
  Administered 2023-04-25 – 2023-04-26 (×2): 2 g via INTRAVENOUS
  Filled 2023-04-25 (×2): qty 100

## 2023-04-25 MED ORDER — ACETAMINOPHEN 10 MG/ML IV SOLN
1000.0000 mg | Freq: Once | INTRAVENOUS | Status: AC
  Administered 2023-04-25: 1000 mg via INTRAVENOUS
  Filled 2023-04-25: qty 100

## 2023-04-25 MED ORDER — MIDAZOLAM HCL 2 MG/2ML IJ SOLN
INTRAMUSCULAR | Status: DC | PRN
  Administered 2023-04-25 (×2): 1 mg via INTRAVENOUS

## 2023-04-25 MED ORDER — BUPIVACAINE HCL (PF) 0.25 % IJ SOLN
INTRAMUSCULAR | Status: AC
  Filled 2023-04-25: qty 30

## 2023-04-25 MED ORDER — METOCLOPRAMIDE HCL 5 MG PO TABS: 5.0000 mg | ORAL_TABLET | Freq: Three times a day (TID) | ORAL | Status: DC | PRN

## 2023-04-25 MED ORDER — BUPIVACAINE-EPINEPHRINE (PF) 0.25% -1:200000 IJ SOLN
INTRAMUSCULAR | Status: DC | PRN
  Administered 2023-04-25: 30 mL via PERINEURAL

## 2023-04-25 MED ORDER — ONDANSETRON HCL 4 MG/2ML IJ SOLN
INTRAMUSCULAR | Status: DC | PRN
  Administered 2023-04-25: 4 mg via INTRAVENOUS

## 2023-04-25 MED ORDER — METHOCARBAMOL 500 MG PO TABS
500.0000 mg | ORAL_TABLET | Freq: Four times a day (QID) | ORAL | Status: DC | PRN
  Administered 2023-04-25 – 2023-04-26 (×3): 500 mg via ORAL
  Filled 2023-04-25 (×5): qty 1

## 2023-04-25 MED ORDER — POVIDONE-IODINE 10 % EX SWAB
2.0000 | Freq: Once | CUTANEOUS | Status: AC
  Administered 2023-04-25: 2 via TOPICAL

## 2023-04-25 MED ORDER — MIDAZOLAM HCL 2 MG/2ML IJ SOLN
INTRAMUSCULAR | Status: AC
  Filled 2023-04-25: qty 2

## 2023-04-25 MED ORDER — DEXAMETHASONE SODIUM PHOSPHATE 10 MG/ML IJ SOLN: 8.0000 mg | Freq: Once | INTRAMUSCULAR | Status: DC

## 2023-04-25 MED ORDER — HYDRALAZINE HCL 50 MG PO TABS
100.0000 mg | ORAL_TABLET | Freq: Two times a day (BID) | ORAL | Status: DC
  Administered 2023-04-25 – 2023-04-27 (×4): 100 mg via ORAL
  Filled 2023-04-25 (×4): qty 2

## 2023-04-25 MED ORDER — FENTANYL CITRATE (PF) 100 MCG/2ML IJ SOLN
INTRAMUSCULAR | Status: AC
  Filled 2023-04-25: qty 2

## 2023-04-25 MED ORDER — WATER FOR IRRIGATION, STERILE IR SOLN
Status: DC | PRN
  Administered 2023-04-25 (×2): 1000 mL

## 2023-04-25 MED ORDER — ONDANSETRON HCL 4 MG PO TABS: 4.0000 mg | ORAL_TABLET | Freq: Four times a day (QID) | ORAL | Status: DC | PRN

## 2023-04-25 MED ORDER — CEFAZOLIN SODIUM-DEXTROSE 2-4 GM/100ML-% IV SOLN
2.0000 g | INTRAVENOUS | Status: AC
  Administered 2023-04-25: 2 g via INTRAVENOUS
  Filled 2023-04-25: qty 100

## 2023-04-25 MED ORDER — FENTANYL CITRATE PF 50 MCG/ML IJ SOSY: 25.0000 ug | PREFILLED_SYRINGE | INTRAMUSCULAR | Status: DC | PRN

## 2023-04-25 MED ORDER — LACTATED RINGERS IV SOLN: INTRAVENOUS | Status: DC

## 2023-04-25 MED ORDER — MENTHOL 3 MG MT LOZG: 1.0000 | LOZENGE | OROMUCOSAL | Status: DC | PRN

## 2023-04-25 MED ORDER — ACETAMINOPHEN 325 MG PO TABS: 325.0000 mg | ORAL_TABLET | Freq: Four times a day (QID) | ORAL | Status: DC | PRN

## 2023-04-25 MED ORDER — RIVAROXABAN 10 MG PO TABS
10.0000 mg | ORAL_TABLET | Freq: Every day | ORAL | Status: DC
  Administered 2023-04-26 – 2023-04-27 (×2): 10 mg via ORAL
  Filled 2023-04-25 (×2): qty 1

## 2023-04-25 MED ORDER — CARVEDILOL 12.5 MG PO TABS
12.5000 mg | ORAL_TABLET | Freq: Every day | ORAL | Status: DC
  Administered 2023-04-25 – 2023-04-26 (×2): 12.5 mg via ORAL
  Filled 2023-04-25 (×2): qty 1

## 2023-04-25 MED ORDER — FESOTERODINE FUMARATE ER 4 MG PO TB24
4.0000 mg | ORAL_TABLET | Freq: Every day | ORAL | Status: DC
  Administered 2023-04-26 – 2023-04-27 (×2): 4 mg via ORAL
  Filled 2023-04-25 (×2): qty 1

## 2023-04-25 MED ORDER — BUPIVACAINE IN DEXTROSE 0.75-8.25 % IT SOLN
INTRATHECAL | Status: DC | PRN
  Administered 2023-04-25: 1.8 mL via INTRATHECAL

## 2023-04-25 MED ORDER — MORPHINE SULFATE (PF) 2 MG/ML IV SOLN: 0.5000 mg | INTRAVENOUS | Status: DC | PRN

## 2023-04-25 MED ORDER — SODIUM CHLORIDE 0.9 % IV SOLN: INTRAVENOUS | Status: DC

## 2023-04-25 MED ORDER — CARBOXYMETHYLCELLULOSE SODIUM 0.5 % OP SOLN: 1.0000 [drp] | Freq: Three times a day (TID) | OPHTHALMIC | Status: DC | PRN

## 2023-04-25 MED ORDER — POLYVINYL ALCOHOL 1.4 % OP SOLN: 1.0000 [drp] | Freq: Three times a day (TID) | OPHTHALMIC | Status: DC | PRN

## 2023-04-25 MED ORDER — METOCLOPRAMIDE HCL 5 MG/ML IJ SOLN: 5.0000 mg | Freq: Three times a day (TID) | INTRAMUSCULAR | Status: DC | PRN

## 2023-04-25 MED ORDER — PHENOL 1.4 % MT LIQD: 1.0000 | OROMUCOSAL | Status: DC | PRN

## 2023-04-25 MED ORDER — DEXAMETHASONE SODIUM PHOSPHATE 10 MG/ML IJ SOLN
INTRAMUSCULAR | Status: DC | PRN
  Administered 2023-04-25: 10 mg via INTRAVENOUS

## 2023-04-25 MED ORDER — SIMVASTATIN 20 MG PO TABS
10.0000 mg | ORAL_TABLET | Freq: Every day | ORAL | Status: DC
  Administered 2023-04-26: 10 mg via ORAL
  Filled 2023-04-25: qty 1

## 2023-04-25 MED ORDER — AMISULPRIDE (ANTIEMETIC) 5 MG/2ML IV SOLN: 10.0000 mg | Freq: Once | INTRAVENOUS | Status: DC | PRN

## 2023-04-25 MED ORDER — 0.9 % SODIUM CHLORIDE (POUR BTL) OPTIME
TOPICAL | Status: DC | PRN
  Administered 2023-04-25: 1000 mL

## 2023-04-25 MED ORDER — ORAL CARE MOUTH RINSE: 15.0000 mL | Freq: Once | OROMUCOSAL | Status: AC

## 2023-04-25 MED ORDER — LIDOCAINE 2% (20 MG/ML) 5 ML SYRINGE
INTRAMUSCULAR | Status: DC | PRN
  Administered 2023-04-25: 60 mg via INTRAVENOUS

## 2023-04-25 MED ORDER — CHLORHEXIDINE GLUCONATE 0.12 % MT SOLN
15.0000 mL | Freq: Once | OROMUCOSAL | Status: AC
  Administered 2023-04-25: 15 mL via OROMUCOSAL

## 2023-04-25 MED ORDER — POLYETHYLENE GLYCOL 3350 17 G PO PACK: 17.0000 g | PACK | Freq: Every day | ORAL | Status: DC | PRN

## 2023-04-25 MED ORDER — BISACODYL 10 MG RE SUPP: 10.0000 mg | Freq: Every day | RECTAL | Status: DC | PRN

## 2023-04-25 MED ORDER — TRAMADOL HCL 50 MG PO TABS
50.0000 mg | ORAL_TABLET | Freq: Four times a day (QID) | ORAL | Status: DC | PRN
  Administered 2023-04-25 – 2023-04-26 (×2): 50 mg via ORAL
  Filled 2023-04-25: qty 2
  Filled 2023-04-25: qty 1

## 2023-04-25 MED ORDER — DOCUSATE SODIUM 100 MG PO CAPS
100.0000 mg | ORAL_CAPSULE | Freq: Two times a day (BID) | ORAL | Status: DC
  Administered 2023-04-25 – 2023-04-27 (×4): 100 mg via ORAL
  Filled 2023-04-25 (×4): qty 1

## 2023-04-25 MED ORDER — HYDROCODONE-ACETAMINOPHEN 5-325 MG PO TABS
1.0000 | ORAL_TABLET | ORAL | Status: DC | PRN
  Administered 2023-04-25 – 2023-04-27 (×7): 1 via ORAL
  Filled 2023-04-25 (×6): qty 1
  Filled 2023-04-25: qty 2
  Filled 2023-04-25: qty 1

## 2023-04-25 MED ORDER — MAGNESIUM CITRATE PO SOLN: 1.0000 | Freq: Once | ORAL | Status: DC | PRN

## 2023-04-25 MED ORDER — DEXAMETHASONE SODIUM PHOSPHATE 10 MG/ML IJ SOLN
10.0000 mg | Freq: Once | INTRAMUSCULAR | Status: AC
  Administered 2023-04-26: 10 mg via INTRAVENOUS
  Filled 2023-04-25: qty 1

## 2023-04-25 MED ORDER — FENTANYL CITRATE (PF) 100 MCG/2ML IJ SOLN
INTRAMUSCULAR | Status: DC | PRN
  Administered 2023-04-25: 50 ug via INTRAVENOUS

## 2023-04-25 MED ORDER — ONDANSETRON HCL 4 MG/2ML IJ SOLN: 4.0000 mg | Freq: Four times a day (QID) | INTRAMUSCULAR | Status: DC | PRN

## 2023-04-25 MED ORDER — METHOCARBAMOL 500 MG IVPB - SIMPLE MED: 500.0000 mg | Freq: Four times a day (QID) | INTRAVENOUS | Status: DC | PRN

## 2023-04-25 SURGICAL SUPPLY — 44 items
BAG COUNTER SPONGE SURGICOUNT (BAG) IMPLANT
BAG DECANTER FOR FLEXI CONT (MISCELLANEOUS) IMPLANT
BAG SPEC THK2 15X12 ZIP CLS (MISCELLANEOUS)
BAG SPNG CNTER NS LX DISP (BAG)
BAG ZIPLOCK 12X15 (MISCELLANEOUS) IMPLANT
BLADE SAG 18X100X1.27 (BLADE) ×1 IMPLANT
COVER PERINEAL POST (MISCELLANEOUS) ×1 IMPLANT
COVER SURGICAL LIGHT HANDLE (MISCELLANEOUS) ×1 IMPLANT
CUP ACET PINNACLE SECTR 50MM (Hips) IMPLANT
DRAPE FOOT SWITCH (DRAPES) ×1 IMPLANT
DRAPE STERI IOBAN 125X83 (DRAPES) ×1 IMPLANT
DRAPE U-SHAPE 47X51 STRL (DRAPES) ×2 IMPLANT
DRSG AQUACEL AG ADV 3.5X10 (GAUZE/BANDAGES/DRESSINGS) ×1 IMPLANT
DURAPREP 26ML APPLICATOR (WOUND CARE) ×1 IMPLANT
ELECT REM PT RETURN 15FT ADLT (MISCELLANEOUS) ×1 IMPLANT
GLOVE BIO SURGEON STRL SZ 6.5 (GLOVE) IMPLANT
GLOVE BIO SURGEON STRL SZ7.5 (GLOVE) IMPLANT
GLOVE BIO SURGEON STRL SZ8 (GLOVE) ×1 IMPLANT
GLOVE BIOGEL PI IND STRL 6.5 (GLOVE) IMPLANT
GLOVE BIOGEL PI IND STRL 7.0 (GLOVE) IMPLANT
GLOVE BIOGEL PI IND STRL 8 (GLOVE) ×1 IMPLANT
GOWN STRL REUS W/ TWL LRG LVL3 (GOWN DISPOSABLE) ×1 IMPLANT
GOWN STRL REUS W/ TWL XL LVL3 (GOWN DISPOSABLE) IMPLANT
GOWN STRL REUS W/TWL LRG LVL3 (GOWN DISPOSABLE) ×1
GOWN STRL REUS W/TWL XL LVL3 (GOWN DISPOSABLE)
HEAD FEM STD 32X+5 STRL (Hips) IMPLANT
HOLDER FOLEY CATH W/STRAP (MISCELLANEOUS) ×1 IMPLANT
KIT TURNOVER KIT A (KITS) IMPLANT
LINER MARATHON 32 50 (Hips) IMPLANT
MANIFOLD NEPTUNE II (INSTRUMENTS) ×1 IMPLANT
PACK ANTERIOR HIP CUSTOM (KITS) ×1 IMPLANT
PENCIL SMOKE EVACUATOR COATED (MISCELLANEOUS) ×1 IMPLANT
PINNACLE SECTOR CUP 50MM (Hips) ×1 IMPLANT
SPIKE FLUID TRANSFER (MISCELLANEOUS) ×1 IMPLANT
STEM FEM ACTIS STD SZ4 (Stem) IMPLANT
STRIP CLOSURE SKIN 1/2X4 (GAUZE/BANDAGES/DRESSINGS) ×1 IMPLANT
SUT ETHIBOND NAB CT1 #1 30IN (SUTURE) ×1 IMPLANT
SUT MNCRL AB 4-0 PS2 18 (SUTURE) ×1 IMPLANT
SUT STRATAFIX 0 PDS 27 VIOLET (SUTURE) ×1
SUT VIC AB 2-0 CT1 27 (SUTURE) ×2
SUT VIC AB 2-0 CT1 TAPERPNT 27 (SUTURE) ×2 IMPLANT
SUTURE STRATFX 0 PDS 27 VIOLET (SUTURE) ×1 IMPLANT
TRAY FOLEY MTR SLVR 16FR STAT (SET/KITS/TRAYS/PACK) ×1 IMPLANT
TUBE SUCTION HIGH CAP CLEAR NV (SUCTIONS) ×1 IMPLANT

## 2023-04-25 NOTE — Op Note (Signed)
OPERATIVE REPORT- TOTAL HIP ARTHROPLASTY   PREOPERATIVE DIAGNOSIS: Osteoarthritis of the Left hip.   POSTOPERATIVE DIAGNOSIS: Osteoarthritis of the Left  hip.   PROCEDURE: Left total hip arthroplasty, anterior approach.   SURGEON: Ollen Gross, MD   ASSISTANT: Leilani Able, PA-C  ANESTHESIA:  Spinal  ESTIMATED BLOOD LOSS:-200 mL    DRAINS: None  COMPLICATIONS: None   CONDITION: PACU - hemodynamically stable.   BRIEF CLINICAL NOTE: Kaitlyn Gallegos is a 77 y.o. female who has advanced end-  stage arthritis of their Left  hip with progressively worsening pain and  dysfunction.The patient has failed nonoperative management and presents for  total hip arthroplasty.   PROCEDURE IN DETAIL: After successful administration of spinal  anesthetic, the traction boots for the [facility] bed were placed on both  feet and the patient was placed onto the [facility] bed, boots placed into the leg  holders. The Left hip was then isolated from the perineum with plastic  drapes and prepped and draped in the usual sterile fashion. ASIS and  greater trochanter were marked and a oblique incision was made, starting  at about 1 cm lateral and 2 cm distal to the ASIS and coursing towards  the anterior cortex of the femur. The skin was cut with a 10 blade  through subcutaneous tissue to the level of the fascia overlying the  tensor fascia lata muscle. The fascia was then incised in line with the  incision at the junction of the anterior third and posterior 2/3rd. The  muscle was teased off the fascia and then the interval between the TFL  and the rectus was developed. The Hohmann retractor was then placed at  the top of the femoral neck over the capsule. The vessels overlying the  capsule were cauterized and the fat on top of the capsule was removed.  A Hohmann retractor was then placed anterior underneath the rectus  femoris to give exposure to the entire anterior capsule. A T-shaped  capsulotomy was  performed. The edges were tagged and the femoral head  was identified.       Osteophytes are removed off the superior acetabulum.  The femoral neck was then cut in situ with an oscillating saw. Traction  was then applied to the left lower extremity utilizing the [facility]  traction. The femoral head was then removed. Retractors were placed  around the acetabulum and then circumferential removal of the labrum was  performed. Osteophytes were also removed. Reaming starts at 47 mm to  medialize and  Increased in 2 mm increments to 49 mm. We reamed in  approximately 40 degrees of abduction, 20 degrees anteversion. A 50 mm  pinnacle acetabular shell was then impacted in anatomic position under  fluoroscopic guidance with excellent purchase. We did not need to place  any additional dome screws. A 32 mm neutral + 4 marathon liner was then  placed into the acetabular shell.       The femoral lift was then placed along the lateral aspect of the femur  just distal to the vastus ridge. The leg was  externally rotated and capsule  was stripped off the inferior aspect of the femoral neck down to the  level of the lesser trochanter, this was done with electrocautery. The femur was lifted after this was performed. The  leg was then placed in an extended and adducted position essentially delivering the femur. We also removed the capsule superiorly and the piriformis from the piriformis fossa to  gain excellent exposure of the  proximal femur. Rongeur was used to remove some cancellous bone to get  into the lateral portion of the proximal femur for placement of the  initial starter reamer. The starter broaches was placed  the starter broach  and was shown to go down the center of the canal. Broaching  with the Actis system was then performed starting at size 0  coursing  Up to size 4. A size 4 had excellent torsional and rotational  and axial stability. The trial standard offset neck was then placed  with a 32 + 5  trial head. The hip was then reduced. We confirmed that  the stem was in the canal both on AP and lateral x-rays. It also has excellent sizing. The hip was reduced with outstanding stability through full extension and full external rotation.. AP pelvis was taken and the leg lengths were measured and found to be equal. Hip was then dislocated again and the femoral head and neck removed. The  femoral broach was removed. Size 4 Actis stem with a standard offset  neck was then impacted into the femur following native anteversion. Has  excellent purchase in the canal. Excellent torsional and rotational and  axial stability. It is confirmed to be in the canal on AP and lateral  fluoroscopic views. The 32 + 5 metal head was placed and the hip  reduced with outstanding stability. Again AP pelvis was taken and it  confirmed that the leg lengths were equal. The wound was then copiously  irrigated with saline solution and the capsule reattached and repaired  with Ethibond suture. 30 ml of .25% Bupivicaine was  injected into the capsule and into the edge of the tensor fascia lata as well as subcutaneous tissue. The fascia overlying the tensor fascia lata was then closed with a running #1 V-Loc. Subcu was closed with interrupted 2-0 Vicryl and subcuticular running 4-0 Monocryl. Incision was cleaned  and dried. Steri-Strips and a bulky sterile dressing applied. The patient was awakened and transported to  recovery in stable condition.        Please note that a surgical assistant was a medical necessity for this procedure to perform it in a safe and expeditious manner. Assistant was necessary to provide appropriate retraction of vital neurovascular structures and to prevent femoral fracture and allow for anatomic placement of the prosthesis.  Ollen Gross, M.D.

## 2023-04-25 NOTE — Interval H&P Note (Signed)
History and Physical Interval Note:  04/25/2023 11:29 AM  Kaitlyn Gallegos  has presented today for surgery, with the diagnosis of Left hip osteoarthritis.  The various methods of treatment have been discussed with the patient and family. After consideration of risks, benefits and other options for treatment, the patient has consented to  Procedure(s): TOTAL HIP ARTHROPLASTY ANTERIOR APPROACH (Left) as a surgical intervention.  The patient's history has been reviewed, patient examined, no change in status, stable for surgery.  I have reviewed the patient's chart and labs.  Questions were answered to the patient's satisfaction.     Homero Fellers Gadiel John

## 2023-04-25 NOTE — Discharge Instructions (Addendum)
Kaitlyn Aluisio, MD Total Joint Specialist [facility] [address] [place], [place] [address] [phone]  ANTERIOR APPROACH TOTAL HIP REPLACEMENT POSTOPERATIVE DIRECTIONS     Hip Rehabilitation, Guidelines Following Surgery  The results of a hip operation are greatly improved after range of motion and muscle strengthening exercises. Follow all safety measures which are given to protect your hip. If any of these exercises cause increased pain or swelling in your joint, decrease the amount until you are comfortable again. Then slowly increase the exercises. Call your caregiver if you have problems or questions.   BLOOD CLOT PREVENTION Take a 10 mg Xarelto once a day for three weeks following surgery. Then resume one 81 mg aspirin once a day. You may resume your vitamins/supplements once you have discontinued the Xarelto. Do not take any NSAIDs (Advil, Aleve, Ibuprofen, Meloxicam, etc.) until you have discontinued the Xarelto.   HOME CARE INSTRUCTIONS  Remove items at home which could result in a fall. This includes throw rugs or furniture in walking pathways.  ICE to the affected hip as frequently as 20-30 minutes an hour and then as needed for pain and swelling. Continue to use ice on the hip for pain and swelling from surgery. You may notice swelling that will progress down to the foot and ankle. This is normal after surgery. Elevate the leg when you are not up walking on it.   Continue to use the breathing machine which will help keep your temperature down.  It is common for your temperature to cycle up and down following surgery, especially at night when you are not up moving around and exerting yourself.  The breathing machine keeps your lungs expanded and your temperature down.  DIET You may resume your previous home diet once your are discharged from the hospital.  DRESSING / WOUND CARE / SHOWERING You have an adhesive waterproof bandage over the  incision. Leave this in place until your first follow-up appointment. Once you remove this you will not need to place another bandage.  You may begin showering 3 days following surgery, but do not submerge the incision under water.  ACTIVITY For the first 3-5 days, it is important to rest and keep the operative leg elevated. You should, as a general rule, rest for 50 minutes and walk/stretch for 10 minutes per hour. After 5 days, you may slowly increase activity as tolerated.  Perform the exercises you were provided twice a day for about 15-20 minutes each session. Begin these 2 days following surgery. Walk with your walker as instructed. Use the walker until you are comfortable transitioning to a cane. Walk with the cane in the opposite hand of the operative leg. You may discontinue the cane once you are comfortable and walking steadily. Avoid periods of inactivity such as sitting longer than an hour when not asleep. This helps prevent blood clots.  Do not drive a car for 6 weeks or until released by your surgeon.  Do not drive while taking narcotics.  TED HOSE STOCKINGS Wear the elastic stockings on both legs for three weeks following surgery during the day. You may remove them at night while sleeping.  WEIGHT BEARING Weight bearing as tolerated with assist device (walker, cane, etc) as directed, use it as long as suggested by your surgeon or therapist, typically at least 4-6 weeks.  POSTOPERATIVE CONSTIPATION PROTOCOL Constipation - defined medically as fewer than three stools per week and severe constipation as less than one stool per week.    One of the most common issues patients have following surgery is constipation.  Even if you have a regular bowel pattern at home, your normal regimen is likely to be disrupted due to multiple reasons following surgery.  Combination of anesthesia, postoperative narcotics, change in appetite and fluid intake all can affect your bowels.  In order to avoid  complications following surgery, here are some recommendations in order to help you during your recovery period.  Colace (docusate) - Pick up an over-the-counter form of Colace or another stool softener and take twice a day as long as you are requiring postoperative pain medications.  Take with a full glass of water daily.  If you experience loose stools or diarrhea, hold the colace until you stool forms back up.  If your symptoms do not get better within 1 week or if they get worse, check with your doctor. Dulcolax (bisacodyl) - Pick up over-the-counter and take as directed by the product packaging as needed to assist with the movement of your bowels.  Take with a full glass of water.  Use this product as needed if not relieved by Colace only.  MiraLax (polyethylene glycol) - Pick up over-the-counter to have on hand.  MiraLax is a solution that will increase the amount of water in your bowels to assist with bowel movements.  Take as directed and can mix with a glass of water, juice, soda, coffee, or tea.  Take if you go more than two days without a movement.Do not use MiraLax more than once per day. Call your doctor if you are still constipated or irregular after using this medication for 7 days in a row.  If you continue to have problems with postoperative constipation, please contact the office for further assistance and recommendations.  If you experience "the worst abdominal pain ever" or develop nausea or vomiting, please contact the office immediatly for further recommendations for treatment.  ITCHING  If you experience itching with your medications, try taking only a single pain pill, or even half a pain pill at a time.  You can also use Benadryl over the counter for itching or also to help with sleep.   MEDICATIONS See your medication summary on the "After Visit Summary" that the nursing staff will review with you prior to discharge.  You may have some home medications which will be placed on  hold until you complete the course of blood thinner medication.  It is important for you to complete the blood thinner medication as prescribed by your surgeon.  Continue your approved medications as instructed at time of discharge.  PRECAUTIONS If you experience chest pain or shortness of breath - call 911 immediately for transfer to the hospital emergency department.  If you develop a fever greater that 101 F, purulent drainage from wound, increased redness or drainage from wound, foul odor from the wound/dressing, or calf pain - CONTACT YOUR SURGEON.                                                   FOLLOW-UP APPOINTMENTS Make sure you keep all of your appointments after your operation with your surgeon and caregivers. You should call the office at the above phone number and make an appointment for approximately two weeks after the date of your surgery or on the date instructed by your surgeon outlined in   the "After Visit Summary".  RANGE OF MOTION AND STRENGTHENING EXERCISES  These exercises are designed to help you keep full movement of your hip joint. Follow your caregiver's or physical therapist's instructions. Perform all exercises about fifteen times, three times per day or as directed. Exercise both hips, even if you have had only one joint replacement. These exercises can be done on a training (exercise) mat, on the floor, on a table or on a bed. Use whatever works the best and is most comfortable for you. Use music or television while you are exercising so that the exercises are a pleasant break in your day. This will make your life better with the exercises acting as a break in routine you can look forward to.  Lying on your back, slowly slide your foot toward your buttocks, raising your knee up off the floor. Then slowly slide your foot back down until your leg is straight again.  Lying on your back spread your legs as far apart as you can without causing discomfort.  Lying on your side,  raise your upper leg and foot straight up from the floor as far as is comfortable. Slowly lower the leg and repeat.  Lying on your back, tighten up the muscle in the front of your thigh (quadriceps muscles). You can do this by keeping your leg straight and trying to raise your heel off the floor. This helps strengthen the largest muscle supporting your knee.  Lying on your back, tighten up the muscles of your buttocks both with the legs straight and with the knee bent at a comfortable angle while keeping your heel on the floor.   POST-OPERATIVE OPIOID TAPER INSTRUCTIONS: It is important to wean off of your opioid medication as soon as possible. If you do not need pain medication after your surgery it is ok to stop day one. Opioids include: Codeine, Hydrocodone(Norco, Vicodin), Oxycodone(Percocet, oxycontin) and hydromorphone amongst others.  Long term and even short term use of opiods can cause: Increased pain response Dependence Constipation Depression Respiratory depression And more.  Withdrawal symptoms can include Flu like symptoms Nausea, vomiting And more Techniques to manage these symptoms Hydrate well Eat regular healthy meals Stay active Use relaxation techniques(deep breathing, meditating, yoga) Do Not substitute Alcohol to help with tapering If you have been on opioids for less than two weeks and do not have pain than it is ok to stop all together.  Plan to wean off of opioids This plan should start within one week post op of your joint replacement. Maintain the same interval or time between taking each dose and first decrease the dose.  Cut the total daily intake of opioids by one tablet each day Next start to increase the time between doses. The last dose that should be eliminated is the evening dose.   IF YOU ARE TRANSFERRED TO A SKILLED REHAB FACILITY If the patient is transferred to a skilled rehab facility following release from the hospital, a list of the current  medications will be sent to the facility for the patient to continue.  When discharged from the skilled rehab facility, please have the facility set up the patient's Home Health Physical Therapy prior to being released. Also, the skilled facility will be responsible for providing the patient with their medications at time of release from the facility to include their pain medication, the muscle relaxants, and their blood thinner medication. If the patient is still at the rehab facility at time of the two week follow   up appointment, the skilled rehab facility will also need to assist the patient in arranging follow up appointment in our office and any transportation needs.  MAKE SURE YOU:  Understand these instructions.  Get help right away if you are not doing well or get worse.    DENTAL ANTIBIOTICS:  In most cases prophylactic antibiotics for Dental procdeures after total joint surgery are not necessary.  Exceptions are as follows:  1. History of prior total joint infection  2. Severely immunocompromised (Organ Transplant, cancer chemotherapy, Rheumatoid biologic meds such as Humera)  3. Poorly controlled diabetes (A1C &gt; 8.0, blood glucose over 200)  If you have one of these conditions, contact your surgeon for an antibiotic prescription, prior to your dental procedure.    Pick up stool softner and laxative for home use following surgery while on pain medications. Do not submerge incision under water. Please use good hand washing techniques while changing dressing each day. May shower starting three days after surgery. Please use a clean towel to pat the incision dry following showers. Continue to use ice for pain and swelling after surgery. Do not use any lotions or creams on the incision until instructed by your surgeon.  Information on my medicine - XARELTO (Rivaroxaban)    Why was Xarelto prescribed for you? Xarelto was prescribed for you to reduce the risk of blood  clots forming after orthopedic surgery. The medical term for these abnormal blood clots is venous thromboembolism (VTE).  What do you need to know about xarelto ? Take your Xarelto ONCE DAILY at the same time every day. You may take it either with or without food.  If you have difficulty swallowing the tablet whole, you may crush it and mix in applesauce just prior to taking your dose.  Take Xarelto exactly as prescribed by your doctor and DO NOT stop taking Xarelto without talking to the doctor who prescribed the medication.  Stopping without other VTE prevention medication to take the place of Xarelto may increase your risk of developing a clot.  After discharge, you should have regular check-up appointments with your healthcare provider that is prescribing your Xarelto.    What do you do if you miss a dose? If you miss a dose, take it as soon as you remember on the same day then continue your regularly scheduled once daily regimen the next day. Do not take two doses of Xarelto on the same day.   Important Safety Information A possible side effect of Xarelto is bleeding. You should call your healthcare provider right away if you experience any of the following: Bleeding from an injury or your nose that does not stop. Unusual colored urine (red or dark brown) or unusual colored stools (red or black). Unusual bruising for unknown reasons. A serious fall or if you hit your head (even if there is no bleeding).  Some medicines may interact with Xarelto and might increase your risk of bleeding while on Xarelto. To help avoid this, consult your healthcare provider or pharmacist prior to using any new prescription or non-prescription medications, including herbals, vitamins, non-steroidal anti-inflammatory drugs (NSAIDs) and supplements.  This website has more information on Xarelto: www.xarelto.com.    

## 2023-04-25 NOTE — Evaluation (Signed)
Physical Therapy Evaluation Patient Details Name: Kaitlyn Gallegos MRN: [id number] DOB: 29-May-1946 Today's Date: 04/25/2023  History of Present Illness  77 yo female presents to therapy s/p L THA, anterior approach on 04/25/2023 due to failure of conservative measures. Pt PMH includes but is not limited to: anemia, HTN, HDL, TIA, and skin ca.  Clinical Impression  Kaitlyn Gallegos is a 77 y.o. female POD 0 s/p L TKA, AA. Patient reports mod I at [facility] level with mobility at baseline. Patient is now limited by functional impairments (see PT problem list below) and requires min A for bed mobility and min guard and cues for transfers. Patient was able to ambulate 24 feet with RW and min guard level of assist. Patient instructed in exercise to facilitate ROM and circulation to manage edema.  Patient will benefit from continued skilled PT interventions to address impairments and progress towards PLOF. Acute PT will follow to progress mobility and stair training in preparation for safe discharge home with family support and HEP.        Recommendations for follow up therapy are one component of a multi-disciplinary discharge planning process, led by the attending physician.  Recommendations may be updated based on patient status, additional functional criteria and insurance authorization.  Follow Up Recommendations       Assistance Recommended at Discharge Intermittent Supervision/Assistance  Patient can return home with the following  A little help with walking and/or transfers;A little help with bathing/dressing/bathroom;Assistance with cooking/housework;Help with stairs or ramp for entrance;Assist for transportation    Equipment Recommendations Rolling walker (2 wheels)  Recommendations for Other Services       Functional Status Assessment Patient has had a recent decline in their functional status and demonstrates the ability to make significant improvements in function in a reasonable and predictable amount  of time.     Precautions / Restrictions Precautions Precautions: Fall Restrictions Weight Bearing Restrictions: No      Mobility  Bed Mobility Overal bed mobility: Needs Assistance Bed Mobility: Supine to Sit     Supine to sit: Min assist     General bed mobility comments: cues and HOB elevated    Transfers Overall transfer level: Needs assistance Equipment used: Rolling walker (2 wheels) Transfers: Sit to/from Stand Sit to Stand: Min guard           General transfer comment: cues for proper UE and AD placement    Ambulation/Gait Ambulation/Gait assistance: Min guard Gait Distance (Feet): 24 Feet Assistive device: Rolling walker (2 wheels) Gait Pattern/deviations: Step-to pattern, Antalgic Gait velocity: decreased     General Gait Details: gait limited in room secondary to pt having dinner arrive  Stairs            Wheelchair Mobility    Modified Rankin (Stroke Patients Only)       Balance Overall balance assessment: Needs assistance Sitting-balance support: Feet supported Sitting balance-Leahy Scale: Good     Standing balance support: Bilateral upper extremity supported, During functional activity, Reliant on assistive device for balance Standing balance-Leahy Scale: Poor                               Pertinent Vitals/Pain Pain Assessment Pain Assessment: 0-10 Pain Score: 4  Pain Location: L hip Pain Descriptors / Indicators: Constant, Discomfort, Dull Pain Intervention(s): Limited activity within patient's tolerance, Monitored during session, Premedicated before session, Heat applied    Home Living Family/patient expects to be  discharged to:: Private residence Living Arrangements: Children Available Help at Discharge: Family Type of Home: House Home Access: Stairs to enter Entrance Stairs-Rails: None Entrance Stairs-Number of Steps: 1 Alternate Level Stairs-Number of Steps: 16 Home Layout: Two level Home Equipment:  Cane - quad      Prior Function Prior Level of Function : Independent/Modified Independent             Mobility Comments: mod I with SPC level for all ADLs, self care tasks, IADLs       Hand Dominance        Extremity/Trunk Assessment        Lower Extremity Assessment Lower Extremity Assessment: LLE deficits/detail LLE Deficits / Details: ankle DF/PF 4+/5 LLE Sensation: WNL    Cervical / Trunk Assessment Cervical / Trunk Assessment: Normal  Communication   Communication: HOH  Cognition Arousal/Alertness: Awake/alert Behavior During Therapy: WFL for tasks assessed/performed Overall Cognitive Status: Within Functional Limits for tasks assessed                                          General Comments      Exercises Total Joint Exercises Ankle Circles/Pumps: AROM, Both, 20 reps   Assessment/Plan    PT Assessment Patient needs continued PT services  PT Problem List Decreased strength;Decreased range of motion;Decreased activity tolerance;Decreased balance;Decreased mobility;Decreased coordination;Pain       PT Treatment Interventions DME instruction;Gait training;Stair training;Functional mobility training;Therapeutic activities;Therapeutic exercise;Balance training;Neuromuscular re-education;Patient/family education;Modalities    PT Goals (Current goals can be found in the Care Plan section)  Acute Rehab PT Goals Patient Stated Goal: to get up and go, PT Goal Formulation: With patient Time For Goal Achievement: 05/09/23 Potential to Achieve Goals: Good    Frequency 7X/week     Co-evaluation               AM-PAC PT "6 Clicks" Mobility  Outcome Measure Help needed turning from your back to your side while in a flat bed without using bedrails?: A Little Help needed moving from lying on your back to sitting on the side of a flat bed without using bedrails?: A Little Help needed moving to and from a bed to a chair (including a  wheelchair)?: A Little Help needed standing up from a chair using your arms (e.g., wheelchair or bedside chair)?: A Little Help needed to walk in hospital room?: A Little Help needed climbing 3-5 steps with a railing? : A Lot 6 Click Score: 17    End of Session Equipment Utilized During Treatment: Gait belt Activity Tolerance: Patient tolerated treatment well;No increased pain Patient left: in chair;with call bell/phone within reach;with family/visitor present Nurse Communication: Mobility status PT Visit Diagnosis: Unsteadiness on feet (R26.81);Other abnormalities of gait and mobility (R26.89);Muscle weakness (generalized) (M62.81);Pain;Difficulty in walking, not elsewhere classified (R26.2) Pain - Right/Left: Left Pain - part of body: Hip    Time: 1610-9604 PT Time Calculation (min) (ACUTE ONLY): 31 min   Charges:   PT Evaluation $PT Eval Low Complexity: 1 Low PT Treatments $Gait Training: 8-22 mins        Rica Mote, PT   Jacqualyn Posey 04/25/2023, 7:33 PM

## 2023-04-25 NOTE — Transfer of Care (Signed)
Immediate Anesthesia Transfer of Care Note  Patient: Kaitlyn Gallegos  Procedure(s) Performed: Procedure(s): TOTAL HIP ARTHROPLASTY ANTERIOR APPROACH (Left)  Patient Location: PACU  Anesthesia Type:Spinal  Level of Consciousness: awake, alert  and oriented  Airway & Oxygen Therapy: Patient Spontanous Breathing  Post-op Assessment: Report given to RN and Post -op Vital signs reviewed and stable  Post vital signs: Reviewed and stable  Last Vitals:  Vitals:   04/25/23 1038 04/25/23 1045  BP: (!) 168/70 (!) 156/68  Pulse: 78   Resp: 20   Temp: 36.7 C   SpO2: 98%     Complications: No apparent anesthesia complications

## 2023-04-25 NOTE — Anesthesia Postprocedure Evaluation (Signed)
Anesthesia Post Note  Patient: Kaitlyn Gallegos  Procedure(s) Performed: TOTAL HIP ARTHROPLASTY ANTERIOR APPROACH (Left: Hip)     Patient location during evaluation: PACU Anesthesia Type: Spinal Level of consciousness: awake and alert Pain management: pain level controlled Vital Signs Assessment: post-procedure vital signs reviewed and stable Respiratory status: spontaneous breathing and respiratory function stable Cardiovascular status: blood pressure returned to baseline and stable Postop Assessment: spinal receding Anesthetic complications: no  No notable events documented.  Last Vitals:  Vitals:   04/25/23 1600 04/25/23 1615  BP: 123/62 131/60  Pulse: 66 69  Resp: 12 16  Temp:  (!) 36.3 C  SpO2: 99% 95%    Last Pain:  Vitals:   04/25/23 1615  TempSrc:   PainSc: 0-No pain    LLE Motor Response: Purposeful movement (04/25/23 1615) LLE Sensation: Decreased;Numbness (04/25/23 1615) RLE Motor Response: Purposeful movement (04/25/23 1615) RLE Sensation: Decreased;Numbness (04/25/23 1615) L Sensory Level: S1-Sole of foot, small toes (04/25/23 1615) R Sensory Level: S1-Sole of foot, small toes (04/25/23 1615)  Kennieth Rad

## 2023-04-25 NOTE — Anesthesia Procedure Notes (Signed)
Spinal  Patient location during procedure: OR Start time: 04/25/2023 12:28 PM Reason for block: surgical anesthesia Staffing Performed: resident/CRNA  Anesthesiologist: Marcene Duos, MD Resident/CRNA: Basilio Cairo, CRNA Performed by: Basilio Cairo, CRNA Authorized by: Marcene Duos, MD   Preanesthetic Checklist Completed: patient identified, IV checked, site marked, risks and benefits discussed, surgical consent, monitors and equipment checked, pre-op evaluation and timeout performed Spinal Block Patient position: sitting Prep: DuraPrep Patient monitoring: heart rate, continuous pulse ox, blood pressure and cardiac monitor Approach: midline Location: L3-4 Injection technique: single-shot Needle Needle type: Whitacre and Introducer  Needle gauge: 24 G Needle length: 9 cm Assessment Sensory level: T4 Events: CSF return Additional Notes Negative paresthesia. Negative blood return. Positive free-flowing CSF. Expiration date of kit checked and confirmed. Patient tolerated procedure well, without complications.

## 2023-04-26 ENCOUNTER — Encounter (HOSPITAL_COMMUNITY): Payer: Self-pay | Admitting: Orthopedic Surgery

## 2023-04-26 DIAGNOSIS — M1612 Unilateral primary osteoarthritis, left hip: Secondary | ICD-10-CM | POA: Diagnosis not present

## 2023-04-26 LAB — BASIC METABOLIC PANEL
Anion gap: 8 (ref 5–15)
BUN: 24 mg/dL — ABNORMAL HIGH (ref 8–23)
CO2: 22 mmol/L (ref 22–32)
Calcium: 8.3 mg/dL — ABNORMAL LOW (ref 8.9–10.3)
Chloride: 105 mmol/L (ref 98–111)
Creatinine, Ser: 0.89 mg/dL (ref 0.44–1.00)
GFR, Estimated: >60 mL/min (ref >60)
Glucose, Bld: 192 mg/dL — ABNORMAL HIGH (ref 70–99)
Potassium: 3.3 mmol/L — ABNORMAL LOW (ref 3.5–5.1)
Sodium: 135 mmol/L (ref 135–145)

## 2023-04-26 LAB — CBC
HCT: 34.7 % — ABNORMAL LOW (ref 36.0–46.0)
Hemoglobin: 11.6 g/dL — ABNORMAL LOW (ref 12.0–15.0)
MCH: 31.6 pg (ref 26.0–34.0)
MCHC: 33.4 g/dL (ref 30.0–36.0)
MCV: 94.6 fL (ref 80.0–100.0)
Platelets: 345 10*3/uL (ref 150–400)
RBC: 3.67 MIL/uL — ABNORMAL LOW (ref 3.87–5.11)
RDW: 13.5 % (ref 11.5–15.5)
WBC: 15.3 10*3/uL — ABNORMAL HIGH (ref 4.0–10.5)
nRBC: 0 % (ref 0.0–0.2)

## 2023-04-26 MED ORDER — METHOCARBAMOL 500 MG PO TABS: 500.0000 mg | ORAL_TABLET | Freq: Four times a day (QID) | ORAL | 0 refills | Status: AC | PRN

## 2023-04-26 MED ORDER — TRAMADOL HCL 50 MG PO TABS: 50.0000 mg | ORAL_TABLET | Freq: Four times a day (QID) | ORAL | 0 refills | Status: AC | PRN

## 2023-04-26 MED ORDER — RIVAROXABAN 10 MG PO TABS: 10.0000 mg | ORAL_TABLET | Freq: Every day | ORAL | 0 refills | Status: AC

## 2023-04-26 MED ORDER — POTASSIUM CHLORIDE CRYS ER 20 MEQ PO TBCR
40.0000 meq | EXTENDED_RELEASE_TABLET | Freq: Once | ORAL | Status: AC
  Administered 2023-04-26: 40 meq via ORAL
  Filled 2023-04-26: qty 2

## 2023-04-26 MED ORDER — HYDROCODONE-ACETAMINOPHEN 5-325 MG PO TABS: 1.0000 | ORAL_TABLET | Freq: Four times a day (QID) | ORAL | 0 refills | Status: AC | PRN

## 2023-04-26 NOTE — TOC Transition Note (Signed)
Transition of Care [facility]) - CM/SW Discharge Note   Patient Details  Name: TASHEEMA PERRONE MRN: [id number] Date of Birth: [birth year]  Transition of Care [facility]) CM/SW Contact:  Amada Jupiter, LCSW Phone Number: 04/26/2023, 11:28 AM   Clinical Narrative:     Met with pt and confirming she has received RW to room via [employer].  Plan for HEP.  No further TOC needs.  Final next level of care: Home/Self Care Barriers to Discharge: No Barriers Identified   Patient Goals and CMS Choice      Discharge Placement                         Discharge Plan and Services Additional resources added to the After Visit Summary for                  DME Arranged: Walker rolling DME Agency: [employer]                  Social Determinants of Health (SDOH) Interventions SDOH Screenings   Food Insecurity: No Food Insecurity (04/25/2023)  Housing: Low Risk  (04/25/2023)  Transportation Needs: No Transportation Needs (04/25/2023)  Utilities: Not At Risk (04/25/2023)  Tobacco Use: Low Risk  (04/26/2023)     Readmission Risk Interventions     No data to display

## 2023-04-26 NOTE — Progress Notes (Signed)
   Subjective: 1 Day Post-Op Procedure(s) (LRB): TOTAL HIP ARTHROPLASTY ANTERIOR APPROACH (Left) Patient reports pain as mild.   Patient seen in rounds by Dr. Lequita Halt. Patient is well, and has had no acute complaints or problems. Denies chest pain or SOB. No issues overnight. Foley catheter removed this AM. We will continue therapy today, ambulated 24' yesterday.   Objective: Vital signs in last 24 hours: Temp:  [97.4 F (36.3 C)-98.4 F (36.9 C)] 98.2 F (36.8 C) (04/30 0544) Pulse Rate:  [61-96] 74 (04/30 0544) Resp:  [10-20] 18 (04/30 0544) BP: (100-168)/(48-77) 124/59 (04/30 0544) SpO2:  [95 %-100 %] 99 % (04/30 0544) Weight:  [75.8 kg] 75.8 kg (04/29 1700)  Intake/Output from previous day:  Intake/Output Summary (Last 24 hours) at 04/26/2023 0828 Last data filed at 04/26/2023 0600 Gross per 24 hour  Intake 1808.36 ml  Output 1950 ml  Net -141.64 ml     Intake/Output this shift: No intake/output data recorded.  Labs: Recent Labs    04/26/23 0335  HGB 11.6*   Recent Labs    04/26/23 0335  WBC 15.3*  RBC 3.67*  HCT 34.7*  PLT 345   Recent Labs    04/26/23 0335  NA 135  K 3.3*  CL 105  CO2 22  BUN 24*  CREATININE 0.89  GLUCOSE 192*  CALCIUM 8.3*   No results for input(s): "LABPT", "INR" in the last 72 hours.  Exam: General - Patient is Alert and Oriented Extremity - Neurologically intact Neurovascular intact Sensation intact distally Dorsiflexion/Plantar flexion intact Dressing - dressing C/D/I Motor Function - intact, moving foot and toes well on exam.   Past Medical History:  Diagnosis Date   Anemia    Arthritis    Dyspnea    GERD (gastroesophageal reflux disease)    H/O tooth extraction    Heart murmur    History of kidney stones    Hyperlipidemia    Hypertension    Pre-diabetes    Prolapse of female pelvic organs    Sinusitis    Skin cancer    TIA (transient ischemic attack) 2010   Vitamin D deficiency      Assessment/Plan: 1 Day Post-Op Procedure(s) (LRB): TOTAL HIP ARTHROPLASTY ANTERIOR APPROACH (Left) Principal Problem:   OA (osteoarthritis) of hip Active Problems:   Primary osteoarthritis of left hip  Estimated body mass index is 27.79 kg/m as calculated from the following:   Height as of this encounter: 5\' 5"  (1.651 m).   Weight as of this encounter: 75.8 kg. Advance diet Up with therapy D/C IV fluids  DVT Prophylaxis - Xarelto Weight bearing as tolerated. Continue therapy.  Plan is to go Home after hospital stay. Plan for discharge with HEP later today if progresses with therapy and meeting goals.  Possibly may need an additional night depending on mobility. She was pretty debilitated preoperatively due to the hip.   Follow-up in the office in 2 weeks.  The PDMP database was reviewed today prior to any opioid medications being prescribed to this patient.  Arther Abbott, PA-C Orthopedic Surgery [phone] 04/26/2023, 8:28 AM

## 2023-04-26 NOTE — Progress Notes (Signed)
Physical Therapy Treatment Patient Details Name: Kaitlyn Gallegos MRN: [id number] DOB: [birth year] Today's Date: 04/26/2023   History of Present Illness 77 yo female presents to therapy s/p L THA, anterior approach on 04/25/2023 due to failure of conservative measures. Pt PMH includes but is not limited to: anemia, HTN, HDL, TIA, and skin ca.    PT Comments    The patient is requiring  encouragement that her progress is going well. Patient does require mod assistance with bed mobility.  Patient practiced 2 and 3 steps with min assistance and much cuing for safety.  Patient has not met goals for safe Dc due to having 16 steps  in her home.  Continue in AM, patient  should progress with further stair practice.  Recommendations for follow up therapy are one component of a multi-disciplinary discharge planning process, led by the attending physician.  Recommendations may be updated based on patient status, additional functional criteria and insurance authorization.  Follow Up Recommendations       Assistance Recommended at Discharge Intermittent Supervision/Assistance  Patient can return home with the following A little help with walking and/or transfers;A little help with bathing/dressing/bathroom;Assistance with cooking/housework;Help with stairs or ramp for entrance;Assist for transportation   Equipment Recommendations  Rolling walker (2 wheels);BSC/3in1    Recommendations for Other Services       Precautions / Restrictions Precautions Precautions: Fall Restrictions Weight Bearing Restrictions: No     Mobility  Bed Mobility Overal bed mobility: Needs Assistance Bed Mobility: Sit to Supine     Supine to sit: Min assist Sit to supine: Mod assist   General bed mobility comments: assist  with legs back onto bed, used belt on Left leg, still required assistance with LLE    Transfers Overall transfer level: Needs assistance Equipment used: Rolling walker (2 wheels) Transfers: Sit  to/from Stand Sit to Stand: Min guard           General transfer comment: cues for proper UE and AD placement    Ambulation/Gait Ambulation/Gait assistance: Min guard Gait Distance (Feet): 60 Feet Assistive device: Rolling walker (2 wheels) Gait Pattern/deviations: Step-to pattern, Step-through pattern Gait velocity: decreased     General Gait Details: patient ambulated  in hall and back, then  to BR and back   Stairs Stairs: Yes Stairs assistance: Min assist Stair Management: One rail Right, Forwards, With cane Number of Stairs: 5 General stair comments: cues for sequence and  safety, required reminder of sequence multiple times   Wheelchair Mobility    Modified Rankin (Stroke Patients Only)       Balance Overall balance assessment: Needs assistance Sitting-balance support: Feet supported Sitting balance-Leahy Scale: Good     Standing balance support: Bilateral upper extremity supported, During functional activity, Reliant on assistive device for balance Standing balance-Leahy Scale: Good Standing balance comment: stood at sink to brush teeth                            Cognition Arousal/Alertness: Awake/alert Behavior During Therapy: WFL for tasks assessed/performed Overall Cognitive Status: Within Functional Limits for tasks assessed                                          Exercises    General Comments        Pertinent Vitals/Pain Pain Assessment Pain Score: 4  Pain  Location: L hip Pain Descriptors / Indicators: Discomfort Pain Intervention(s): Premedicated before session, Monitored during session    Home Living                          Prior Function            PT Goals (current goals can now be found in the care plan section) Progress towards PT goals: Progressing toward goals    Frequency    7X/week      PT Plan Current plan remains appropriate    Co-evaluation               AM-PAC PT "6 Clicks" Mobility   Outcome Measure  Help needed turning from your back to your side while in a flat bed without using bedrails?: A Little Help needed moving from lying on your back to sitting on the side of a flat bed without using bedrails?: A Lot Help needed moving to and from a bed to a chair (including a wheelchair)?: A Little Help needed standing up from a chair using your arms (e.g., wheelchair or bedside chair)?: A Little Help needed to walk in hospital room?: A Little Help needed climbing 3-5 steps with a railing? : A Lot 6 Click Score: 16    End of Session Equipment Utilized During Treatment: Gait belt Activity Tolerance: Patient tolerated treatment well Patient left: in bed;with call bell/phone within reach;with bed alarm set Nurse Communication: Mobility status PT Visit Diagnosis: Unsteadiness on feet (R26.81);Other abnormalities of gait and mobility (R26.89);Muscle weakness (generalized) (M62.81);Pain;Difficulty in walking, not elsewhere classified (R26.2) Pain - Right/Left: Left Pain - part of body: Hip     Time: 1355-1505 PT Time Calculation (min) (ACUTE ONLY): 70 min  Charges:  $Gait Training: 23-37 mins  $Therapeutic Activity: 8-22 mins $Self Care/Home Management: 23-37                     [name] PT Acute Rehabilitation Services Office [phone] Weekend [phone]    [name] 04/26/2023, 3:59 PM

## 2023-04-26 NOTE — Progress Notes (Signed)
Physical Therapy Treatment Patient Details Name: Kaitlyn Gallegos MRN: [id number] DOB: 08-03-1946 Today's Date: 04/26/2023   History of Present Illness 77 yo female presents to therapy s/p L THA, anterior approach on 04/25/2023 due to failure of conservative measures. Pt PMH includes but is not limited to: anemia, HTN, HDL, TIA, and skin ca.    PT Comments    The patient reports that her pain is not controlled. Patient has been premedicated.  Patient ambulated with min guard 20'x 2 and able to manage in bathroom with no balance loss.  Patient has 16 steps to her bedroom, patient reports there is no place to stay on  first floor. Will  begin stair training next visit this PM, to determine safety for Dc today after  session.   Recommendations for follow up therapy are one component of a multi-disciplinary discharge planning process, led by the attending physician.  Recommendations may be updated based on patient status, additional functional criteria and insurance authorization.  Follow Up Recommendations       Assistance Recommended at Discharge Intermittent Supervision/Assistance  Patient can return home with the following A little help with walking and/or transfers;A little help with bathing/dressing/bathroom;Assistance with cooking/housework;Help with stairs or ramp for entrance;Assist for transportation   Equipment Recommendations  Rolling walker (2 wheels)    Recommendations for Other Services       Precautions / Restrictions Precautions Precautions: Fall Restrictions Weight Bearing Restrictions: No     Mobility  Bed Mobility Overal bed mobility: Needs Assistance Bed Mobility: Supine to Sit     Supine to sit: Min assist     General bed mobility comments: Instructed and used  belt to move LLE, cues for technique    Transfers Overall transfer level: Needs assistance Equipment used: Rolling walker (2 wheels) Transfers: Sit to/from Stand Sit to Stand: Min guard            General transfer comment: cues for proper UE and AD placement    Ambulation/Gait Ambulation/Gait assistance: Min guard Gait Distance (Feet): 20 Feet (x 2) Assistive device: Rolling walker (2 wheels) Gait Pattern/deviations: Step-through pattern Gait velocity: decreased     General Gait Details: patient ambukated in room to and from [employer]             Wheelchair Mobility    Modified Rankin (Stroke Patients Only)       Balance Overall balance assessment: Needs assistance Sitting-balance support: Feet supported Sitting balance-Leahy Scale: Good       Standing balance-Leahy Scale: Good Standing balance comment: able to stand and perform pericare and to pull up briefs                            Cognition Arousal/Alertness: Awake/alert Behavior During Therapy: WFL for tasks assessed/performed Overall Cognitive Status: Within Functional Limits for tasks assessed                                          Exercises Total Joint Exercises Ankle Circles/Pumps: AROM Heel Slides: AAROM, Left, 10 reps Hip ABduction/ADduction: AAROM, Left, 10 reps    General Comments        Pertinent Vitals/Pain Pain Assessment Pain Score: 5  Pain Location: L hip Pain Descriptors / Indicators: Constant, Discomfort, Dull Pain Intervention(s): Monitored during session, Premedicated before session, Ice applied  Home Living                          Prior Function            PT Goals (current goals can now be found in the care plan section) Progress towards PT goals: Progressing toward goals    Frequency    7X/week      PT Plan Current plan remains appropriate    Co-evaluation              AM-PAC PT "6 Clicks" Mobility   Outcome Measure  Help needed turning from your back to your side while in a flat bed without using bedrails?: A Little Help needed moving from lying on your back to sitting on the side of a  flat bed without using bedrails?: A Little Help needed moving to and from a bed to a chair (including a wheelchair)?: A Little Help needed standing up from a chair using your arms (e.g., wheelchair or bedside chair)?: A Little Help needed to walk in hospital room?: A Little Help needed climbing 3-5 steps with a railing? : A Lot 6 Click Score: 17    End of Session Equipment Utilized During Treatment: Gait belt Activity Tolerance: Patient tolerated treatment well;No increased pain Patient left: in chair;with call bell/phone within reach;with chair alarm set Nurse Communication: Mobility status PT Visit Diagnosis: Unsteadiness on feet (R26.81);Other abnormalities of gait and mobility (R26.89);Muscle weakness (generalized) (M62.81);Pain;Difficulty in walking, not elsewhere classified (R26.2) Pain - Right/Left: Left Pain - part of body: Hip     Time: 6962-9528 PT Time Calculation (min) (ACUTE ONLY): 38 min  Charges:  $Gait Training: 8-22 mins $Therapeutic Exercise: 8-22 mins $Self Care/Home Management: 8-22                     [name] PT Acute Rehabilitation Services Office [phone] Weekend [phone]     [name] 04/26/2023, 12:54 PM

## 2023-04-27 DIAGNOSIS — M1612 Unilateral primary osteoarthritis, left hip: Secondary | ICD-10-CM | POA: Diagnosis not present

## 2023-04-27 LAB — CBC
HCT: 32.8 % — ABNORMAL LOW (ref 36.0–46.0)
Hemoglobin: 10.8 g/dL — ABNORMAL LOW (ref 12.0–15.0)
MCH: 31 pg (ref 26.0–34.0)
MCHC: 32.9 g/dL (ref 30.0–36.0)
MCV: 94.3 fL (ref 80.0–100.0)
Platelets: 302 10*3/uL (ref 150–400)
RBC: 3.48 MIL/uL — ABNORMAL LOW (ref 3.87–5.11)
RDW: 13.7 % (ref 11.5–15.5)
WBC: 13.5 10*3/uL — ABNORMAL HIGH (ref 4.0–10.5)
nRBC: 0 % (ref 0.0–0.2)

## 2023-04-27 NOTE — Progress Notes (Signed)
Physical Therapy Treatment Patient Details Name: Kaitlyn Gallegos MRN: [id number] DOB: 07/16/1946 Today's Date: 04/27/2023   History of Present Illness 77 yo female presents to therapy s/p L THA, anterior approach on 04/25/2023 due to failure of conservative measures. Pt PMH includes but is not limited to: anemia, HTN, HDL, TIA, and skin ca.    PT Comments    Patient  practiced 8 steps,  patient  provided with written instruction  on steps for family as thay are not present. Patient has met PT goals for DC.   Recommendations for follow up therapy are one component of a multi-disciplinary discharge planning process, led by the attending physician.  Recommendations may be updated based on patient status, additional functional criteria and insurance authorization.  Follow Up Recommendations       Assistance Recommended at Discharge Intermittent Supervision/Assistance  Patient can return home with the following A little help with walking and/or transfers;A little help with bathing/dressing/bathroom;Assistance with cooking/housework;Help with stairs or ramp for entrance;Assist for transportation   Equipment Recommendations  Rolling walker (2 wheels);BSC/3in1    Recommendations for Other Services       Precautions / Restrictions Precautions Precautions: Fall Restrictions Weight Bearing Restrictions: No     Mobility  Bed Mobility   Bed Mobility: Supine to Sit     Supine to sit: Min assist     General bed mobility comments: light assistance  to sit upright    Transfers Overall transfer level: Needs assistance Equipment used: Rolling walker (2 wheels) Transfers: Sit to/from Stand Sit to Stand: Supervision                Ambulation/Gait Ambulation/Gait assistance: Min guard Gait Distance (Feet): 20 Feet (x 2) Assistive device: Rolling walker (2 wheels) Gait Pattern/deviations: Step-to pattern, Step-through pattern           Stairs Stairs: Yes Stairs assistance: Min  assist Stair Management: One rail Right, Forwards, With cane Number of Stairs: 8 General stair comments: cues for sequence and  safety, required reminder of sequence multiple times   Wheelchair Mobility    Modified Rankin (Stroke Patients Only)       Balance Overall balance assessment: Mild deficits observed, not formally tested                                          Cognition Arousal/Alertness: Awake/alert                                              Exercises Total Joint Exercises Ankle Circles/Pumps: AROM Quad Sets: AROM, Both, 10 reps Short Arc Quad: AROM, 10 reps, Left Heel Slides: AAROM, Left, 10 reps    General Comments        Pertinent Vitals/Pain Pain Assessment Pain Score: 3  Pain Location: L hip Pain Descriptors / Indicators: Discomfort Pain Intervention(s): Monitored during session, Premedicated before session    Home Living                          Prior Function            PT Goals (current goals can now be found in the care plan section) Progress towards PT goals: Progressing toward goals    Frequency  7X/week      PT Plan Current plan remains appropriate    Co-evaluation              AM-PAC PT "6 Clicks" Mobility   Outcome Measure  Help needed turning from your back to your side while in a flat bed without using bedrails?: A Little Help needed moving from lying on your back to sitting on the side of a flat bed without using bedrails?: A Little Help needed moving to and from a bed to a chair (including a wheelchair)?: A Little Help needed standing up from a chair using your arms (e.g., wheelchair or bedside chair)?: A Little Help needed to walk in hospital room?: A Little Help needed climbing 3-5 steps with a railing? : A Little 6 Click Score: 18    End of Session Equipment Utilized During Treatment: Gait belt Activity Tolerance: Patient tolerated treatment well Patient  left: in chair;with call bell/phone within reach;with chair alarm set Nurse Communication: Mobility status PT Visit Diagnosis: Unsteadiness on feet (R26.81);Other abnormalities of gait and mobility (R26.89);Muscle weakness (generalized) (M62.81);Pain;Difficulty in walking, not elsewhere classified (R26.2) Pain - Right/Left: Left Pain - part of body: Hip     Time: 1009-1103 PT Time Calculation (min) (ACUTE ONLY): 54 min  Charges:  $Gait Training: 8-22 mins $Therapeutic Exercise: 8-22 mins $Self Care/Home Management: 8-22                     Kaitlyn Gallegos PT Acute Rehabilitation Services Office [phone] Weekend [phone]    Kaitlyn Gallegos 04/27/2023, 1:05 PM

## 2023-04-27 NOTE — Progress Notes (Signed)
   Subjective: 2 Days Post-Op Procedure(s) (LRB): TOTAL HIP ARTHROPLASTY ANTERIOR APPROACH (Left) Patient seen in rounds by Dr. Lequita Halt. Patient is well, and has had no acute complaints or problems. Patient reports pain as moderate.  Worked with physical therapy yesterday and ambulated 60'. Progressing well but reportedly has 16 steps at home.  Objective: Vital signs in last 24 hours: Temp:  [98.2 F (36.8 C)-98.9 F (37.2 C)] 98.6 F (37 C) (05/01 0744) Pulse Rate:  [73-86] 80 (05/01 0744) Resp:  [15-18] 15 (05/01 0744) BP: (130-157)/(56-69) 157/69 (05/01 0744) SpO2:  [94 %-97 %] 97 % (05/01 0744)  Intake/Output from previous day:  Intake/Output Summary (Last 24 hours) at 04/27/2023 0820 Last data filed at 04/27/2023 0600 Gross per 24 hour  Intake 445.88 ml  Output 200 ml  Net 245.88 ml    Intake/Output this shift: No intake/output data recorded.  Labs: Recent Labs    04/26/23 0335 04/27/23 0347  HGB 11.6* 10.8*   Recent Labs    04/26/23 0335 04/27/23 0347  WBC 15.3* 13.5*  RBC 3.67* 3.48*  HCT 34.7* 32.8*  PLT 345 302   Recent Labs    04/26/23 0335  NA 135  K 3.3*  CL 105  CO2 22  BUN 24*  CREATININE 0.89  GLUCOSE 192*  CALCIUM 8.3*   No results for input(s): "LABPT", "INR" in the last 72 hours.  Exam: General - Patient is Alert and Oriented Extremity - Neurologically intact Neurovascular intact Sensation intact distally Dorsiflexion/Plantar flexion intact Dressing/Incision - clean, dry, no drainage Motor Function - intact, moving foot and toes well on exam.  Past Medical History:  Diagnosis Date   Anemia    Arthritis    Dyspnea    GERD (gastroesophageal reflux disease)    H/O tooth extraction    Heart murmur    History of kidney stones    Hyperlipidemia    Hypertension    Pre-diabetes    Prolapse of female pelvic organs    Sinusitis    Skin cancer    TIA (transient ischemic attack) 2010   Vitamin D deficiency      Assessment/Plan: 2 Days Post-Op Procedure(s) (LRB): TOTAL HIP ARTHROPLASTY ANTERIOR APPROACH (Left) Principal Problem:   OA (osteoarthritis) of hip Active Problems:   Primary osteoarthritis of left hip  Estimated body mass index is 27.79 kg/m as calculated from the following:   Height as of this encounter: 5\' 5"  (1.651 m).   Weight as of this encounter: 75.8 kg.  DVT Prophylaxis - Xarelto Weight-bearing as tolerated.  Continue physical therapy. Expected discharge home today pending progress and if meeting patient goals. Plan for HEP once discharged. Follow-up in clinic in 2 weeks.  Alfonzo Feller, PA-C Orthopedic Surgery [phone] 04/27/2023, 8:20 AM

## 2023-04-28 NOTE — Discharge Summary (Signed)
Physician Discharge Summary   Patient ID: Kaitlyn Gallegos MRN: [id number] DOB/AGE: 1946-03-30 77 y.o.  Admit date: 04/25/2023 Discharge date: 04/27/2023  Primary Diagnosis: Osteoarthritis of the left hip   Admission Diagnoses:  Past Medical History:  Diagnosis Date   Anemia    Arthritis    Dyspnea    GERD (gastroesophageal reflux disease)    H/O tooth extraction    Heart murmur    History of kidney stones    Hyperlipidemia    Hypertension    Pre-diabetes    Prolapse of female pelvic organs    Sinusitis    Skin cancer    TIA (transient ischemic attack) 2010   Vitamin D deficiency    Discharge Diagnoses:   Principal Problem:   OA (osteoarthritis) of hip Active Problems:   Primary osteoarthritis of left hip  Estimated body mass index is 27.79 kg/m as calculated from the following:   Height as of this encounter: 5\' 5"  (1.651 m).   Weight as of this encounter: 75.8 kg.  Procedure:  Procedure(s) (LRB): TOTAL HIP ARTHROPLASTY ANTERIOR APPROACH (Left)   Consults: None  HPI: Kaitlyn Gallegos is a 77 y.o. female who has advanced end-  stage arthritis of their Left  hip with progressively worsening pain and dysfunction.The patient has failed nonoperative management and presents for total hip arthroplasty.  Laboratory Data: Admission on 04/25/2023, Discharged on 04/27/2023  Component Date Value Ref Range Status   ABO/RH(D) 04/25/2023 A POS   Final   Antibody Screen 04/25/2023 POS   Final   Sample Expiration 04/25/2023 04/28/2023,2359   Final   Unit Number 04/25/2023 [id number]   Final   Blood Component Type 04/25/2023 RED CELLS,LR   Final   Unit division 04/25/2023 00   Final   Status of Unit 04/25/2023 ALLOCATED   Final   Transfusion Status 04/25/2023 OK TO TRANSFUSE   Final   Crossmatch Result 04/25/2023 COMPATIBLE   Final   Donor AG Type 04/25/2023 NEGATIVE FOR KELL ANTIGEN   Final   Unit Number 04/25/2023 [id number]   Final   Blood Component Type 04/25/2023 RED CELLS,LR    Final   Unit division 04/25/2023 00   Final   Status of Unit 04/25/2023 ALLOCATED   Final   Transfusion Status 04/25/2023 OK TO TRANSFUSE   Final   Crossmatch Result 04/25/2023 COMPATIBLE   Final   Donor AG Type 04/25/2023 NEGATIVE FOR KELL ANTIGEN   Final   Glucose-Capillary 04/25/2023 132 (H)  70 - 99 mg/dL Final   Glucose reference range applies only to samples taken after fasting for at least 8 hours.   Comment 1 04/25/2023 Notify RN   Final   Comment 2 04/25/2023 Document in Chart   Final   Blood Product Unit Number 04/25/2023 [id number]   Final   PRODUCT CODE 04/25/2023 [id number]   Final   Unit Type and Rh 04/25/2023 6200   Final   Blood Product Expiration Date 04/25/2023 034742595638   Final   Blood Product Unit Number 04/25/2023 [id number]   Final   PRODUCT CODE 04/25/2023 E0382V00   Final   Unit Type and Rh 04/25/2023 6200   Final   Blood Product Expiration Date 04/25/2023 166063016010   Final   WBC 04/26/2023 15.3 (H)  4.0 - 10.5 K/uL Final   RBC 04/26/2023 3.67 (L)  3.87 - 5.11 MIL/uL Final   Hemoglobin 04/26/2023 11.6 (L)  12.0 - 15.0 g/dL Final   HCT 93/23/5573 34.7 (L)  36.0 - 46.0 %  Final   MCV 04/26/2023 94.6  80.0 - 100.0 fL Final   MCH 04/26/2023 31.6  26.0 - 34.0 pg Final   MCHC 04/26/2023 33.4  30.0 - 36.0 g/dL Final   RDW 16/09/9603 13.5  11.5 - 15.5 % Final   Platelets 04/26/2023 345  150 - 400 K/uL Final   nRBC 04/26/2023 0.0  0.0 - 0.2 % Final   Performed at [facility], [address], [place], [place] [address]   Sodium 04/26/2023 135  135 - 145 mmol/L Final   Potassium 04/26/2023 3.3 (L)  3.5 - 5.1 mmol/L Final   Chloride 04/26/2023 105  98 - 111 mmol/L Final   CO2 04/26/2023 22  22 - 32 mmol/L Final   Glucose, Bld 04/26/2023 192 (H)  70 - 99 mg/dL Final   Glucose reference range applies only to samples taken after fasting for at least 8 hours.   BUN 04/26/2023 24 (H)  8 - 23 mg/dL Final   Creatinine, Ser 04/26/2023 0.89  0.44 -  1.00 mg/dL Final   Calcium 11/91/4782 8.3 (L)  8.9 - 10.3 mg/dL Final   GFR, Estimated 04/26/2023 >60  >60 mL/min Final   Comment: (NOTE) Calculated using the CKD-EPI Creatinine Equation (2021)    Anion gap 04/26/2023 8  5 - 15 Final   Performed at [facility], [address], [place], [place] [address]   WBC 04/27/2023 13.5 (H)  4.0 - 10.5 K/uL Final   RBC 04/27/2023 3.48 (L)  3.87 - 5.11 MIL/uL Final   Hemoglobin 04/27/2023 10.8 (L)  12.0 - 15.0 g/dL Final   HCT 30/86/5784 32.8 (L)  36.0 - 46.0 % Final   MCV 04/27/2023 94.3  80.0 - 100.0 fL Final   MCH 04/27/2023 31.0  26.0 - 34.0 pg Final   MCHC 04/27/2023 32.9  30.0 - 36.0 g/dL Final   RDW 69/62/9528 13.7  11.5 - 15.5 % Final   Platelets 04/27/2023 302  150 - 400 K/uL Final   nRBC 04/27/2023 0.0  0.0 - 0.2 % Final   Performed at [facility], [address], [place], [place] [address]  Hospital Outpatient Visit on 04/20/2023  Component Date Value Ref Range Status   MRSA, PCR 04/20/2023 NEGATIVE  NEGATIVE Final   Staphylococcus aureus 04/20/2023 NEGATIVE  NEGATIVE Final   Comment: (NOTE) The Xpert SA Assay (FDA approved for NASAL specimens in patients 6 years of age and older), is one component of a comprehensive surveillance program. It is not intended to diagnose infection nor to guide or monitor treatment. Performed at [facility], [address] [place], [place] [address]    Hgb A1c MFr Bld 04/20/2023 6.5 (H)  4.8 - 5.6 % Final   Comment: (NOTE)         Prediabetes: 5.7 - 6.4         Diabetes: >6.4         Glycemic control for adults with diabetes: <7.0    Mean Plasma Glucose 04/20/2023 140  mg/dL Final   Comment: (NOTE) Performed At: [facility] [address] [place], [place] [id number] Jolene Schimke MD [id number]    Sodium 04/20/2023 138  135 - 145 mmol/L Final   Potassium 04/20/2023 4.3  3.5 - 5.1 mmol/L Final   Chloride 04/20/2023 105   98 - 111 mmol/L Final   CO2 04/20/2023 23  22 - 32 mmol/L Final   Glucose, Bld 04/20/2023 111 (H)  70 -  99 mg/dL Final   Glucose reference range applies only to samples taken after fasting for at least 8 hours.   BUN 04/20/2023 25 (H)  8 - 23 mg/dL Final   Creatinine, Ser 04/20/2023 0.89  0.44 - 1.00 mg/dL Final   Calcium 16/09/9603 9.4  8.9 - 10.3 mg/dL Final   GFR, Estimated 04/20/2023 >60  >60 mL/min Final   Comment: (NOTE) Calculated using the CKD-EPI Creatinine Equation (2021)    Anion gap 04/20/2023 10  5 - 15 Final   Performed at [facility], [address] [place], [place] [address]   WBC 04/20/2023 7.8  4.0 - 10.5 K/uL Final   RBC 04/20/2023 4.41  3.87 - 5.11 MIL/uL Final   Hemoglobin 04/20/2023 13.9  12.0 - 15.0 g/dL Final   HCT 11/91/4782 41.6  36.0 - 46.0 % Final   MCV 04/20/2023 94.3  80.0 - 100.0 fL Final   MCH 04/20/2023 31.5  26.0 - 34.0 pg Final   MCHC 04/20/2023 33.4  30.0 - 36.0 g/dL Final   RDW 95/62/1308 13.2  11.5 - 15.5 % Final   Platelets 04/20/2023 419 (H)  150 - 400 K/uL Final   nRBC 04/20/2023 0.0  0.0 - 0.2 % Final   Performed at [facility], [address] [place], [place] [address]   Glucose-Capillary 04/20/2023 116 (H)  70 - 99 mg/dL Final   Glucose reference range applies only to samples taken after fasting for at least 8 hours.   ABO/RH(D) 04/20/2023 A POS   Final   Antibody Screen 04/20/2023 POS   Final   Sample Expiration 04/20/2023 04/25/2023,2359   Final   Antibody Identification 04/20/2023 ANTI K   Final   Extend sample reason 04/20/2023 NO TRANSFUSIONS OR PREGNANCY IN THE PAST 3 MONTHS   Final   Unit Number 04/20/2023 [id number]   Final   Blood Component Type 04/20/2023 RED CELLS,LR   Final   Unit division 04/20/2023 00   Final   Status of Unit 04/20/2023 REL FROM [facility]   Final   Transfusion Status 04/20/2023 OK TO TRANSFUSE   Final   Crossmatch Result 04/20/2023    Final                    Value:COMPATIBLE Performed at [facility], [address] [place], [place] [address]    Unit Number 04/20/2023 [id number]   Final   Blood Component Type 04/20/2023 RED CELLS,LR   Final   Unit division 04/20/2023 00   Final   Status of Unit 04/20/2023 REL FROM [facility]   Final   Transfusion Status 04/20/2023 OK TO TRANSFUSE   Final   Crossmatch Result 04/20/2023 COMPATIBLE   Final   Blood Product Unit Number 04/20/2023 [id number]   Final   PRODUCT CODE 04/20/2023 E0382V00   Final   Unit Type and Rh 04/20/2023 6200   Final   Blood Product Expiration Date 04/20/2023 [id number]   Final   Blood Product Unit Number 04/20/2023 [id number]   Final   PRODUCT CODE 04/20/2023 [id number]   Final   Unit Type and Rh 04/20/2023 6200   Final   Blood Product Expiration Date 04/20/2023 [id number]   Final     X-Rays:DG Pelvis Portable  Result Date: 04/25/2023 CLINICAL DATA:  [id number] Status post hip replacement [id number] EXAM: PORTABLE PELVIS 1-2 VIEWS COMPARISON:  11/11/2010 FINDINGS: Postsurgical changes of left hip arthroplasty. Normal alignment. No evidence of immediate complication. Expected soft tissue changes. Unchanged  right hip arthroplasty. IMPRESSION: Postsurgical changes of left hip arthroplasty. No evidence of immediate hardware complication. Electronically Signed   By: Caprice Renshaw M.D.   On: 04/25/2023 14:46   DG HIP UNILAT WITH PELVIS 1V LEFT  Result Date: 04/25/2023 CLINICAL DATA:  [id number] Surgery, elective [id number] EXAM: DG HIP (WITH OR WITHOUT PELVIS) 1V*L* COMPARISON:  11/11/2010 FINDINGS: Intraoperative images during left hip arthroplasty. Normal alignment. No evidence of loosening or fracture. Expected soft tissue changes. IMPRESSION: Intraoperative images during left hip arthroplasty. No evidence of immediate hardware complication. Electronically Signed   By: Caprice Renshaw M.D.   On: 04/25/2023 13:53   DG C-Arm 1-60 Min-No Report  Result Date: 04/25/2023 Fluoroscopy  was utilized by the requesting physician.  No radiographic interpretation.   DG C-Arm 1-60 Min-No Report  Result Date: 04/25/2023 Fluoroscopy was utilized by the requesting physician.  No radiographic interpretation.    EKG: Orders placed or performed during the hospital encounter of 04/20/23   EKG 12 lead per protocol   EKG 12 lead per protocol     Hospital Course: Kaitlyn Gallegos is a 77 y.o. who was admitted to [facility]. They were brought to the operating room on 04/25/2023 and underwent Procedure(s): TOTAL HIP ARTHROPLASTY ANTERIOR APPROACH.  Patient tolerated the procedure well and was later transferred to the recovery room and then to the orthopaedic floor for postoperative care. They were given PO and IV analgesics for pain control following their surgery. They were given 24 hours of postoperative antibiotics of  Anti-infectives (From admission, onward)    Start     Dose/Rate Route Frequency Ordered Stop   04/25/23 1830  ceFAZolin (ANCEF) IVPB 2g/100 mL premix        2 g 200 mL/hr over 30 Minutes Intravenous Every 6 hours 04/25/23 1633 04/26/23 0106   04/25/23 1045  ceFAZolin (ANCEF) IVPB 2g/100 mL premix        2 g 200 mL/hr over 30 Minutes Intravenous On call to O.R. 04/25/23 1042 04/25/23 1221      and started on DVT prophylaxis in the form of Xarelto.   PT and OT were ordered for total joint protocol. Discharge planning consulted to help with post-op disposition and equipment needs. Patient had a fair night on the evening of surgery. They started to get up OOB with physical therapy on POD #0. Continued to work with physical therapy into POD #2. Patient was seen during rounds on day two and was ready to go home pending progress with physical therapy. Patient worked with physical therapy for a total of 4 sessions and was meeting their goals. Dressing was changed and the incision was C/D/I.  They were discharged home later that day in stable condition.  Diet: Regular  diet Activity: WBAT Follow-up: in 2 weeks Disposition: Home Discharged Condition: stable   Discharge Instructions     Call MD / Call 911   Complete by: As directed    If you experience chest pain or shortness of breath, CALL 911 and be transported to the hospital emergency room.  If you develope a fever above 101 F, pus (white drainage) or increased drainage or redness at the wound, or calf pain, call your surgeon's office.   Change dressing   Complete by: As directed    You have an adhesive waterproof bandage over the incision. Leave this in place until your first follow-up appointment. Once you remove this you will not need to place another bandage.   Constipation Prevention  Complete by: As directed    Drink plenty of fluids.  Prune juice may be helpful.  You may use a stool softener, such as Colace (over the counter) 100 mg twice a day.  Use MiraLax (over the counter) for constipation as needed.   Diet - low sodium heart healthy   Complete by: As directed    Do not sit on low chairs, stoools or toilet seats, as it may be difficult to get up from low surfaces   Complete by: As directed    Driving restrictions   Complete by: As directed    No driving for two weeks   Post-operative opioid taper instructions:   Complete by: As directed    POST-OPERATIVE OPIOID TAPER INSTRUCTIONS: It is important to wean off of your opioid medication as soon as possible. If you do not need pain medication after your surgery it is ok to stop day one. Opioids include: Codeine, Hydrocodone(Norco, Vicodin), Oxycodone(Percocet, oxycontin) and hydromorphone amongst others.  Long term and even short term use of opiods can cause: Increased pain response Dependence Constipation Depression Respiratory depression And more.  Withdrawal symptoms can include Flu like symptoms Nausea, vomiting And more Techniques to manage these symptoms Hydrate well Eat regular healthy meals Stay active Use relaxation  techniques(deep breathing, meditating, yoga) Do Not substitute Alcohol to help with tapering If you have been on opioids for less than two weeks and do not have pain than it is ok to stop all together.  Plan to wean off of opioids This plan should start within one week post op of your joint replacement. Maintain the same interval or time between taking each dose and first decrease the dose.  Cut the total daily intake of opioids by one tablet each day Next start to increase the time between doses. The last dose that should be eliminated is the evening dose.      TED hose   Complete by: As directed    Use stockings (TED hose) for three weeks on both leg(s).  You may remove them at night for sleeping.   Weight bearing as tolerated   Complete by: As directed       Allergies as of 04/27/2023       Reactions   Lisinopril Cough   Metformin And Related Diarrhea        Medication List     STOP taking these medications    aspirin EC 81 MG tablet   Cholecalciferol 125 MCG (5000 UT) Tabs   folic acid 400 MCG tablet Commonly known as: FOLVITE       TAKE these medications    acetaminophen 650 MG CR tablet Commonly known as: TYLENOL Take 650 mg by mouth every 8 (eight) hours as needed for pain.   Allegra Allergy 180 MG tablet Generic drug: fexofenadine Take 180 mg by mouth daily as needed for allergies.   carboxymethylcellulose 0.5 % Soln Commonly known as: REFRESH PLUS Place 1 drop into both eyes 3 (three) times daily as needed (dry eyes).   carvedilol 12.5 MG tablet Commonly known as: COREG Take 12.5 mg by mouth at bedtime.   estradiol 0.1 MG/GM vaginal cream Commonly known as: ESTRACE Place 0.5 Applicatorfuls vaginally 2 (two) times a week.   fenofibrate 160 MG tablet Take 160 mg by mouth daily.   hydrALAZINE 100 MG tablet Commonly known as: APRESOLINE Take 100 mg by mouth 2 (two) times daily.   HYDROcodone-acetaminophen 5-325 MG tablet Commonly known as:  NORCO/VICODIN Take 1-2  tablets by mouth every 6 (six) hours as needed for severe pain.   methocarbamol 500 MG tablet Commonly known as: ROBAXIN Take 1 tablet (500 mg total) by mouth every 6 (six) hours as needed for muscle spasms.   rivaroxaban 10 MG Tabs tablet Commonly known as: XARELTO Take 1 tablet (10 mg total) by mouth daily with breakfast for 20 days. Then resume one 81 mg aspirin once a day.   simvastatin 10 MG tablet Commonly known as: ZOCOR Take 10 mg by mouth at bedtime.   topiramate 50 MG tablet Commonly known as: TOPAMAX Take 50 mg by mouth at bedtime.   traMADol 50 MG tablet Commonly known as: ULTRAM Take 1-2 tablets (50-100 mg total) by mouth every 6 (six) hours as needed for moderate pain. What changed:  how much to take when to take this   trospium 20 MG tablet Commonly known as: SANCTURA Take 20 mg by mouth 2 (two) times daily.               Discharge Care Instructions  (From admission, onward)           Start     Ordered   04/26/23 0000  Weight bearing as tolerated        04/26/23 0832   04/26/23 0000  Change dressing       Comments: You have an adhesive waterproof bandage over the incision. Leave this in place until your first follow-up appointment. Once you remove this you will not need to place another bandage.   04/26/23 1610            Follow-up Information     Ollen Gross, MD. Schedule an appointment as soon as possible for a visit in 2 week(s).   Specialty: Orthopedic Surgery Contact information: [address] [place] [address] [place] [address] [phone]                 Signed: R. Arcola Jansky, PA-C Orthopedic Surgery 04/28/2023, 7:35 AM

## 2023-04-29 LAB — TYPE AND SCREEN
Antibody Screen: POSITIVE
Donor AG Type: NEGATIVE
Unit division: 0
Unit division: 0

## 2023-04-29 LAB — BPAM RBC: Blood Product Expiration Date: 202405202359
